# Patient Record
Sex: Female | Born: 1995 | Race: Black or African American | Hispanic: No | Marital: Married | Smoking: Never smoker
Health system: Southern US, Community
[De-identification: ages and names within clinical notes are randomized; demographics above are authoritative.]

## PROBLEM LIST (undated history)

## (undated) DIAGNOSIS — Z789 Other specified health status: Secondary | ICD-10-CM

## (undated) HISTORY — DX: Other specified health status: Z78.9

## (undated) HISTORY — PX: NO PAST SURGERIES: SHX2092

---

## 2017-11-10 NOTE — L&D Delivery Note (Signed)
**Note Colleen-Identified via Obfuscation** Patient: Colleen Sampson MRN: [id number]  GBS status: Positive, IAP given: PCN   Patient is a 22 y.o. now G1P1 s/p NSVD at 4758w5d, who was admitted for IOL for oligohydramnios and NRNST. SROM 8h 6554m prior to delivery with light meconium stained fluid.    Delivery Note At 2:56 AM a viable female was delivered via Vaginal, Spontaneous (Presentation: LOA).  APGAR: 6, 8; weight 7 lb 4.8 oz (3311 g).   Placenta status: spontaneous, intact.  Cord: 3 vessel with the following complications: loose nuchal x1.   Anesthesia:  Epidural  Episiotomy: None Lacerations: 2nd degree Suture Repair: 3.0 monocryl  Est. Blood Loss (mL): 538  Head delivered LOA. Loose nuchal cord x1. Shoulder and body delivered in usual fashion. Cord reduced on mother's abdomen. Infant with spontaneous cry, placed on mother's abdomen, dried and bulb suctioned. Cord clamped x 2 after 30 second delay, and cut by family member. Cord blood drawn. Placenta delivered spontaneously with gentle cord traction. Fundus firm with massage and Pitocin. Perineum inspected and found to have 2nd degree laceration, which was repaired with 3.0 monocryl with good hemostasis achieved.  Mom to postpartum.  Baby to Couplet care / Skin to Skin.  Colleen Sampson 11/02/2018, 4:01 AM

## 2018-01-25 ENCOUNTER — Encounter: Payer: Self-pay | Admitting: Nurse Practitioner

## 2018-01-25 ENCOUNTER — Ambulatory Visit: Payer: Self-pay | Attending: Nurse Practitioner | Admitting: Nurse Practitioner

## 2018-01-25 VITALS — BP 99/65 | HR 78 | Temp 98.6°F | Ht 64.57 in | Wt 147.8 lb

## 2018-01-25 DIAGNOSIS — N979 Female infertility, unspecified: Secondary | ICD-10-CM | POA: Insufficient documentation

## 2018-01-25 DIAGNOSIS — Z3169 Encounter for other general counseling and advice on procreation: Secondary | ICD-10-CM | POA: Insufficient documentation

## 2018-01-25 NOTE — Progress Notes (Signed)
Assessment & Plan:  There are no diagnoses linked to this encounter.  Patient has been counseled on age-appropriate routine health concerns for screening and prevention. These are reviewed and up-to-date. Referrals have been placed accordingly. Immunizations are up-to-date or declined.    Subjective:   Chief Complaint  Patient presents with  . New Patient (Initial Visit)    Patient is here for primary care. Pt's stated she would like to talk to PCP regarding to get pregnant.    HPI Colleen Sampson 22 y.o. female presents to office today to establish care. [place]   Infertility Patient presents for evaluation of infertility. Patient and partner have been attempting conception for 8 months. Marital Status: married for 9 months. Pregnancies with current partner no  Menstrual and Endocrine History LMP: Patient's last menstrual period was 01/22/2018. Menarche:10 Shortest Interval: 23 Longest Interval: 31  days Duration of flow: 5 days Heavy Menses: yes first 3 days Dysmenorrhea: yes  Obstetrical History Never pregnant  Gynecologic History Last PAP: NONE Previous abdominal or pelvic surgery: no  Sexual History Frequency: 3 or 4 times per week(s) Satisfied: yes Dyspareunia: yes  Contraception None  Family History Thyroid Problems: no Heart Condition or High Blood Pressure: mother and father Blood Clot or Stroke: no Diabetes: no Cancer: no Birth Defects/Inherited diseases:no Infectious diseases (mumps, TB, Rubella):unknown Other Medical Problems: no  Habits Cigarettes:    Wife -  no    Husband - no Alcohol:    Wife -  no    Husband - no Marijuana: NO  Review of Systems  Constitutional: Negative for fever, malaise/fatigue and weight loss.  HENT: Negative.  Negative for nosebleeds.   Eyes: Negative.  Negative for blurred vision, double vision and photophobia.  Respiratory: Negative.  Negative for cough and shortness of breath.     Cardiovascular: Negative.  Negative for chest pain, palpitations and leg swelling.  Gastrointestinal: Negative.  Negative for heartburn, nausea and vomiting.  Genitourinary: Negative.   Musculoskeletal: Negative.  Negative for myalgias.  Neurological: Negative.  Negative for dizziness, focal weakness, seizures and headaches.  Psychiatric/Behavioral: Negative.  Negative for suicidal ideas.    History reviewed. No pertinent past medical history.  History reviewed. No pertinent surgical history.  Family History  Problem Relation Age of Onset  . Hypertension Mother   . Hypertension Father     Social History Reviewed with no changes to be made today.   No outpatient medications prior to visit.   No facility-administered medications prior to visit.     Not on File     Objective:    BP 99/65 (BP Location: Right Arm, Patient Position: Sitting, Cuff Size: Normal)   Pulse 78   Temp 98.6 F (37 C) (Oral)   Ht 5' 4.57" (1.64 m)   Wt 147 lb 12.8 oz (67 kg)   LMP 01/22/2018   SpO2 99%   BMI 24.93 kg/m  Wt Readings from Last 3 Encounters:  01/25/18 147 lb 12.8 oz (67 kg)    Physical Exam  Constitutional: She is oriented to person, place, and time. She appears well-developed and well-nourished. She is cooperative.  HENT:  Head: Normocephalic and atraumatic.  Eyes: EOM are normal.  Neck: Normal range of motion.  Cardiovascular: Normal rate, regular rhythm and normal heart sounds. Exam reveals no gallop and no friction rub.  No murmur heard. Pulmonary/Chest: Effort normal and breath sounds normal. No tachypnea. No respiratory distress. She has no decreased breath sounds.  She has no wheezes. She has no rhonchi. She has no rales. She exhibits no tenderness.  Abdominal: Soft. Bowel sounds are normal.  Musculoskeletal: Normal range of motion. She exhibits no edema.  Neurological: She is alert and oriented to person, place, and time. Coordination normal.  Skin: Skin is warm and  dry.  Psychiatric: She has a normal mood and affect. Her behavior is normal. Judgment and thought content normal.  Nursing note and vitals reviewed.     Patient has been counseled extensively about nutrition and exercise as well as the importance of adherence with medications and regular follow-up. The patient was given clear instructions to go to ER or return to medical center if symptoms don't improve, worsen or new problems develop. The patient verbalized understanding.   Follow-up: No Follow-up on file.   Claiborne Rigg, FNP-BC [facility] [place], [place] [phone]   01/25/2018, 2:50 PM

## 2018-01-25 NOTE — Patient Instructions (Addendum)
Infertility  Infertility is when you are unable to get pregnant (conceive) after a year of having sex regularly without using birth control. Infertility can also mean that a woman is not able to carry a pregnancy to full term.  Both women and men can have fertility problems.  What causes infertility?  What Causes Infertility in Women?  There are many possible causes of infertility in women. For some women, the cause of infertility is not known (unexplained infertility). Infertility can also be linked to more than one cause. Infertility problems in women can be caused by problems with the menstrual cycle or reproductive organs, certain medical conditions, and factors related to lifestyle and age.   Problems with your menstrual cycle can interfere with your ovaries producing eggs (ovulation). This can make it difficult to get pregnant. This includes having a menstrual cycle that is very long, very short, or irregular.   Problems with reproductive organs can include:  ? An abnormally narrow cervix or a cervix that does not remain closed during a pregnancy.  ? A blockage in your fallopian tubes.  ? An abnormally shaped uterus.  ? Uterine fibroids. This is a tissue mass (tumor) that can develop on your uterus.   Medical conditions that can affect a woman's fertility include:  ? Polycystic ovarian syndrome (PCOS). This is a hormonal disorder that can cause small cysts to grow on your ovaries. This is the most common cause of infertility in women.  ? Endometriosis. This is a condition in which the tissue that lines your uterus (endometrium) grows outside of its normal location.  ? Primary ovary insufficiency. This is when your ovaries stop producing eggs and hormones before the age of 40.  ? Sexually transmitted diseases, such as chlamydia or gonorrhea. These infections can cause scarring in your fallopian tubes. This makes it difficult for eggs to reach your uterus.  ? Autoimmune disorders. These are disorders in which  your immune system attacks normal, healthy cells.  ? Hormone imbalances.   Other factors include:  ? Age. A woman's fertility declines with age, especially after her mid-30s.  ? Being under- or overweight.  ? Drinking too much alcohol.  ? Using drugs.  ? Exercising excessively.  ? Being exposed to environmental toxins, such as radiation, pesticides, and certain chemicals.    What Causes Infertility in Men?  There are many causes of infertility in men. Infertility can be linked to more than one cause. Infertility problems in men can be caused by problems with sperm or the reproductive organs, certain medical conditions, and factors related to lifestyle and age. Some men have unexplained infertility.   Problems with sperm. Infertility can result if there is a problem producing:  ? Enough sperm (low sperm count).  ? Enough normally-shaped sperm (sperm morphology).  ? Sperm that are able to reach the egg (poor motility).   Infertility can also be caused by:  ? A problem with hormones.  ? Enlarged veins (varicoceles), cysts (spermatoceles), or tumors of the testicles.  ? Sexual dysfunction.  ? Injury to the testicles.  ? A birth defect, such as not having the tubes that carry sperm (vas deferens).   Medical conditions that can affect a man's fertility include:  ? Diabetes.  ? Cancer treatments, such as chemotherapy or radiation.  ? Klinefelter syndrome. This is an inherited genetic disorder.  ? Thyroid problems, such as an under- or overactive thyroid.  ? Cystic fibrosis.  ? Sexually transmitted diseases.   Other factors   include:  ? Age. A man's fertility declines with age.  ? Drinking too much alcohol.  ? Using drugs.  ? Being exposed to environmental toxins, such as pesticides and lead.    What are the symptoms of infertility?  Being unable to get pregnant after one year of having regular sex without using birth control is the only sign of infertility.  How is infertility diagnosed?  In order to be diagnosed with  infertility, both partners will have a physical exam. Both partners will also have an extensive medical and sexual history taken. If there is no obvious reason for infertility, additional tests may be done.  What Tests Will Women Have?  Women may first have tests to check whether they are ovulating each month. The tests may include:   Blood tests to check hormone levels.   An ultrasound of the ovaries. This looks for possible problems on or in the ovaries.   Taking a small sample of the tissue that lines the uterus for examination under a microscope (endometrial biopsy).    Women who are ovulating may have additional tests. These may include:   Hysterosalpingography.  ? This is an X-ray of the fallopian tubes and uterus taken after a specific type of dye is injected.  ? This test can show the shape of the uterus and whether the fallopian tubes are open.   Laparoscopy.  ? In this test, a lighted tube (laparoscope) is used to look for problems in the fallopian tubes and other female organs.   Transvaginal ultrasound.  ? This is an imaging test to check for abnormalities of the uterus and ovaries.  ? A health care provider can use this test to count the number of follicles on the ovaries.   Hysteroscopy.  ? This test involves using a lighted tube to examine the cervix and inside the uterus.  ? It is done to find any abnormalities inside the uterus.    What Tests Will Men Have?  Tests for men's infertility includes:   Semen tests to check sperm count, morphology, and motility.   Blood tests to check for hormone levels.   Taking a small sample of tissue from inside a testicle (biopsy). This is examined under a microscope.   Blood tests to check for genetic abnormalities (genetic testing).    How are women treated for infertility?  Treatment depends on the cause of infertility. Most cases of infertility in women are treated with medicine or surgery.   Women may take medicine to:  ? Correct ovulation  problems.  ? Treat other health conditions, such as PCOS.   Surgery may be done to:  ? Repair damage to the ovaries, fallopian tubes, cervix, or uterus.  ? Remove growths from the uterus.  ? Remove scar tissue from the uterus, pelvis, or other female organs.    How are men treated for infertility?  Treatment depends on the cause of infertility. Most cases of infertility in men are treated with medicine or surgery.   Men may take medicine to:  ? Correct hormone problems.  ? Treat other health conditions.  ? Treat sexual dysfunction.   Surgery may be done to:  ? Remove blockages in the reproductive tract.  ? Correct other structural problems of the reproductive tract.    What is assisted reproductive technology?  Assisted reproductive technology (ART) refers to all treatments and procedures that combine eggs and sperm outside the body to try to help a couple conceive. ART is often   combined with fertility drugs to stimulate ovulation. Sometimes ART is done using eggs retrieved from another woman's body (donor eggs) or from previously frozen fertilized eggs (embryos).  There are different types of ART. These include:   Intrauterine insemination (IUI).  ? In this procedure, sperm is placed directly into a woman's uterus with a long, thin tube.  ? This may be most effective for infertility caused by sperm problems, including low sperm count and low motility.  ? Can be used in combination with fertility drugs.   In vitro fertilization (IVF).  ? This is often done when a woman's fallopian tubes are blocked or when a man has low sperm counts.  ? Fertility drugs stimulate the ovaries to produce multiple eggs. Once mature, these eggs are removed from the body and combined with the sperm to be fertilized.  ? These fertilized eggs are then placed in the woman's uterus.    This information is not intended to replace advice given to you by your health care provider. Make sure you discuss any questions you have with your  health care provider.  Document Released: 10/30/2003 Document Revised: 03/28/2016 Document Reviewed: 07/12/2014  [employer] Interactive Patient Education  2018 [employer]

## 2018-02-05 ENCOUNTER — Ambulatory Visit: Payer: Self-pay | Attending: Nurse Practitioner

## 2018-03-08 ENCOUNTER — Other Ambulatory Visit: Payer: Self-pay | Admitting: Family Medicine

## 2018-03-08 MED ORDER — PRENATAL VITAMIN 27-0.8 MG PO TABS: 1.0000 | ORAL_TABLET | Freq: Every day | ORAL | 1 refills | Status: DC

## 2018-03-10 ENCOUNTER — Ambulatory Visit: Payer: Self-pay | Attending: Nurse Practitioner

## 2018-03-23 ENCOUNTER — Ambulatory Visit (INDEPENDENT_AMBULATORY_CARE_PROVIDER_SITE_OTHER): Payer: Self-pay | Admitting: Nurse Practitioner

## 2018-03-23 ENCOUNTER — Encounter (INDEPENDENT_AMBULATORY_CARE_PROVIDER_SITE_OTHER): Payer: Self-pay | Admitting: Nurse Practitioner

## 2018-03-23 ENCOUNTER — Other Ambulatory Visit: Payer: Self-pay

## 2018-03-23 VITALS — BP 111/71 | HR 84 | Temp 98.2°F | Wt 146.0 lb

## 2018-03-23 DIAGNOSIS — Z3490 Encounter for supervision of normal pregnancy, unspecified, unspecified trimester: Secondary | ICD-10-CM

## 2018-03-23 DIAGNOSIS — N912 Amenorrhea, unspecified: Secondary | ICD-10-CM

## 2018-03-23 NOTE — Patient Instructions (Signed)
Preparing for Pregnancy If you are considering becoming pregnant, make an appointment to see your regular health care provider to learn how to prepare for a safe and healthy pregnancy (preconception care). During a preconception care visit, your health care provider will:  Do a complete physical exam, including a Pap test.  Take a complete medical history.  Give you information, answer your questions, and help you resolve problems.  Preconception checklist Medical history  Tell your health care provider about any current or past medical conditions. Your pregnancy or your ability to become pregnant may be affected by chronic conditions, such as diabetes, chronic hypertension, and thyroid problems.  Include your family's medical history as well as your partner's medical history.  Tell your health care provider about any history of STIs (sexually transmitted infections).These can affect your pregnancy. In some cases, they can be passed to your baby. Discuss any concerns that you have about STIs.  If indicated, discuss the benefits of genetic testing. This testing will show whether there are any genetic conditions that may be passed from you or your partner to your baby.  Tell your health care provider about: ? Any problems you have had with conception or pregnancy. ? Any medicines you take. These include vitamins, herbal supplements, and over-the-counter medicines. ? Your history of immunizations. Discuss any vaccinations that you may need.  Diet  Ask your health care provider what to include in a healthy diet that has a balance of nutrients. This is especially important when you are pregnant or preparing to become pregnant.  Ask your health care provider to help you reach a healthy weight before pregnancy. ? If you are overweight, you may be at higher risk for certain complications, such as high blood pressure, diabetes, and preterm birth. ? If you are underweight, you are more likely  to have a baby who has a low birth weight.  Lifestyle, work, and home  Let your health care provider know: ? About any lifestyle habits that you have, such as alcohol use, drug use, or smoking. ? About recreational activities that may put you at risk during pregnancy, such as downhill skiing and certain exercise programs. ? Tell your health care provider about any international travel, especially any travel to places with an active Zika virus outbreak. ? About harmful substances that you may be exposed to at work or at home. These include chemicals, pesticides, radiation, or even litter boxes. ? If you do not feel safe at home.  Mental health  Tell your health care provider about: ? Any history of mental health conditions, including feelings of depression, sadness, or anxiety. ? Any medicines that you take for a mental health condition. These include herbs and supplements.  Home instructions to prepare for pregnancy Lifestyle  Eat a balanced diet. This includes fresh fruits and vegetables, whole grains, lean meats, low-fat dairy products, healthy fats, and foods that are high in fiber. Ask to meet with a nutritionist or registered dietitian for assistance with meal planning and goals.  Get regular exercise. Try to be active for at least 30 minutes a day on most days of the week. Ask your health care provider which activities are safe during pregnancy.  Do not use any products that contain nicotine or tobacco, such as cigarettes and e-cigarettes. If you need help quitting, ask your health care provider.  Do not drink alcohol.  Do not take illegal drugs.  Maintain a healthy weight. Ask your health care provider what weight range is   right for you.  General instructions  Keep an accurate record of your menstrual periods. This makes it easier for your health care provider to determine your baby's due date.  Begin taking prenatal vitamins and folic acid supplements daily as directed by  your health care provider.  Manage any chronic conditions, such as high blood pressure and diabetes, as told by your health care provider. This is important.  How do I know that I am pregnant? You may be pregnant if you have been sexually active and you miss your period. Symptoms of early pregnancy include:  Mild cramping.  Very light vaginal bleeding (spotting).  Feeling unusually tired.  Nausea and vomiting (morning sickness).  If you have any of these symptoms and you suspect that you might be pregnant, you can take a home pregnancy test. These tests check for a hormone in your urine (human chorionic gonadotropin, or hCG). A woman's body begins to make this hormone during early pregnancy. These tests are very accurate. Wait until at least the first day after you miss your period to take one. If the test shows that you are pregnant (you get a positive result), call your health care provider to make an appointment for prenatal care. What should I do if I become pregnant?  Make an appointment with your health care provider as soon as you suspect you are pregnant.  Do not use any products that contain nicotine, such as cigarettes, chewing tobacco, and e-cigarettes. If you need help quitting, ask your health care provider.  Do not drink alcoholic beverages. Alcohol is related to a number of birth defects.  Avoid toxic odors and chemicals.  You may continue to have sexual intercourse if it does not cause pain or other problems, such as vaginal bleeding. This information is not intended to replace advice given to you by your health care provider. Make sure you discuss any questions you have with your health care provider. Document Released: 10/09/2008 Document Revised: 06/24/2016 Document Reviewed: 05/18/2016 Elsevier Interactive Patient Education  2018 [employer].  Pregnancy, The Father's Role A father has an important role during his partner's pregnancy, labor, delivery, and after  the birth of the baby. It is important to help and support your partner through this new period. There are many physical and emotional changes that happen. To be helpful and supportive during this time, you should know and understand what is happening to your partner during pregnancy, labor, delivery, and after the baby is born. What are the stages of pregnancy? Pregnancy usually lasts about 40 weeks. The pregnancy is divided into three trimesters. First Trimester During the first 13 weeks, your partner may:  Feel tired.  Have painful breasts.  Feel nauseous or throw up.  Urinate more often.  Have mood changes.  All of these changes are normal. If they are happening, try to be helpful, supportive, and understanding. This may include helping with household duties and activities and spending more time with each other. Second Trimester During the next 14-28 weeks:  Your partner will likely feel better and more energetic.  This is the best time of the pregnancy to be more active together.  You will be able to see her belly showing the pregnancy.  You may be able to feel the baby kick.  Your partner may have soreness or aching in her back as she gains weight. You can help her by carrying heavy things and by rubbing her back when she is feeling sore.  Third Trimester During  the final 12 weeks, your partner may:  Become more uncomfortable as the baby grows.  Have a hard time doing everyday activities, and her balance may be off.  Have a hard time bending over.  Tire easily.  Have difficulty sleeping.  At this time, the birth of your baby is close. You and your partner may have concerns or questions. This is normal. Talk with each other and with your health care provider. Continue to help your partner with housework, encourage her to rest, and rub her sore back and legs, if this helps her. What can I expect or do during the pregnancy? You can expect to experience some changes.  There are also many things you can do to help prepare you and your partner for your baby. Emotional Changes During your partner's pregnancy, emotional changes for you may include:  Having feelings of happiness, excitement, and pride.  Being concerned about having new responsibilities, such as financial or educational responsibilities.  Feeling overwhelmed or scared.  Being worried that a baby will change your relationship with your partner.  These feelings are normal. Talk about them openly with your partner and your health care provider. Prenatal Care Attend prenatal care visits with your partner. This is a good time for you to get to know your health care provider, follow the pregnancy, and ask questions.  Prenatal visits usually occur one time each month for six months, then every two weeks for two months, and then one time each week during the last month. You may have more prenatal visits if your health care provider believes this is needed.  Your health care provider usually does an ultrasound of the baby at one of the prenatal visits. This may happen more often if your health care provider thinks it is needed.  Sexual Activity Sexual intercourse is safe unless there is a problem with the pregnancy and your health care provider advises you to not have sexual intercourse. Because physical and emotional changes happen in pregnancy, your partner may not want to have sex during certain times. Trying different positions may make sexual intercourse more comfortable. However, always respect your partner's decision if she does not want to have sex. It is important for both of you to discuss your feelings and desires. Talk with your health care provider about any questions that you may have about sexual intercourse during pregnancy. Childbirth Classes Attend childbirth classes with your partner if you are able. Classes prepare you and help you to understand what happens during labor and delivery,  and they help you and your partner to bond. There are even some classes that are only for new fathers. Classes also teach you and your partner:  Various relaxation techniques.  How to work with her labor pains.  How to focus during labor and delivery.  What should I know about labor and delivery? Many fathers want to be present while their partner is going through labor and delivery. You may:  Be asked to time the contractions, massage your partner's back, and breathe with her during the contractions.  Get to see and enjoy the excitement of your baby being born, and you may even be able to cut your baby's umbilical cord. If you feel like you might faint or you are uncomfortable, ask someone to help you.  Need to leave the room if a problem develops during labor or delivery.  A cesarean delivery, or C-section, is a procedure that may be used to deliver the baby. It is done  through an incision in the abdomen and the uterus. A cesarean delivery may be scheduled or it may be an emergency procedure during labor and delivery. Most hospitals allow the father to be in the room for a cesarean delivery unless it is an emergency. Recovery from a cesarean delivery usually requires more help from the father. What happens after delivery? After your baby is born, your partner will go through many changes again. These changes could last a few months or longer. Postpartum Depression Your partner may take awhile to regain her strength. She may also have feelings of sadness (postpartum blues or postpartum depression). If your partner is acting unusually sad or depressed, talk with your health care provider right away. This can be a serious medical condition that requires treatment. Breastfeeding Your partner may decide to breastfeed the baby. This helps with bonding between the mother and the baby, and breast milk is the best nutrition for your baby. You can feel included by burping the baby and bottle-feeding  the baby with breast milk that was collected from the mother. This allows your partner to rest and helps you to bond with your baby. Sexual Activity It may take a few months for your partner's body to heal and be ready for sexual intercourse again. This may take longer after a cesarean delivery. If you have any questions about having sexual intercourse or if it is painful for your partner, talk with your health care provider. It is possible for breastfeeding mothers to become pregnant even if they are not having menstrual periods. Use birth control (contraception) unless you and your partner would like to become pregnant again. What should I remember? Fatherhood and having a baby is an ongoing learning experience. It is common to be anxious, concerned, or afraid that you may not be taking care of your newborn baby properly. It is important to talk with your partner and your health care provider if you are worried or have any questions. This information is not intended to replace advice given to you by your health care provider. Make sure you discuss any questions you have with your health care provider. Document Released: 04/14/2008 Document Revised: 03/31/2016 Document Reviewed: 07/14/2014 Elsevier Interactive Patient Education  2017 [employer].  First Trimester of Pregnancy The first trimester of pregnancy is from week 1 until the end of week 13 (months 1 through 3). During this time, your baby will begin to develop inside you. At 6-8 weeks, the eyes and face are formed, and the heartbeat can be seen on ultrasound. At the end of 12 weeks, all the baby's organs are formed. Prenatal care is all the medical care you receive before the birth of your baby. Make sure you get good prenatal care and follow all of your doctor's instructions. Follow these instructions at home: Medicines  Take over-the-counter and prescription medicines only as told by your doctor. Some medicines are safe and some medicines  are not safe during pregnancy.  Take a prenatal vitamin that contains at least 600 micrograms (mcg) of folic acid.  If you have trouble pooping (constipation), take medicine that will make your stool soft (stool softener) if your doctor approves. Eating and drinking  Eat regular, healthy meals.  Your doctor will tell you the amount of weight gain that is right for you.  Avoid raw meat and uncooked cheese.  If you feel sick to your stomach (nauseous) or throw up (vomit): ? Eat 4 or 5 small meals a day instead of 3  large meals. ? Try eating a few soda crackers. ? Drink liquids between meals instead of during meals.  To prevent constipation: ? Eat foods that are high in fiber, like fresh fruits and vegetables, whole grains, and beans. ? Drink enough fluids to keep your pee (urine) clear or pale yellow. Activity  Exercise only as told by your doctor. Stop exercising if you have cramps or pain in your lower belly (abdomen) or low back.  Do not exercise if it is too hot, too humid, or if you are in a place of great height (high altitude).  Try to avoid standing for long periods of time. Move your legs often if you must stand in one place for a long time.  Avoid heavy lifting.  Wear low-heeled shoes. Sit and stand up straight.  You can have sex unless your doctor tells you not to. Relieving pain and discomfort  Wear a good support bra if your breasts are sore.  Take warm water baths (sitz baths) to soothe pain or discomfort caused by hemorrhoids. Use hemorrhoid cream if your doctor says it is okay.  Rest with your legs raised if you have leg cramps or low back pain.  If you have puffy, bulging veins (varicose veins) in your legs: ? Wear support hose or compression stockings as told by your doctor. ? Raise (elevate) your feet for 15 minutes, 3-4 times a day. ? Limit salt in your food. Prenatal care  Schedule your prenatal visits by the twelfth week of pregnancy.  Write down  your questions. Take them to your prenatal visits.  Keep all your prenatal visits as told by your doctor. This is important. Safety  Wear your seat belt at all times when driving.  Make a list of emergency phone numbers. The list should include numbers for family, friends, the hospital, and police and fire departments. General instructions  Ask your doctor for a referral to a local prenatal class. Begin classes no later than at the start of month 6 of your pregnancy.  Ask for help if you need counseling or if you need help with nutrition. Your doctor can give you advice or tell you where to go for help.  Do not use hot tubs, steam rooms, or saunas.  Do not douche or use tampons or scented sanitary pads.  Do not cross your legs for long periods of time.  Avoid all herbs and alcohol. Avoid drugs that are not approved by your doctor.  Do not use any tobacco products, including cigarettes, chewing tobacco, and electronic cigarettes. If you need help quitting, ask your doctor. You may get counseling or other support to help you quit.  Avoid cat litter boxes and soil used by cats. These carry germs that can cause birth defects in the baby and can cause a loss of your baby (miscarriage) or stillbirth.  Visit your dentist. At home, brush your teeth with a soft toothbrush. Be gentle when you floss. Contact a doctor if:  You are dizzy.  You have mild cramps or pressure in your lower belly.  You have a nagging pain in your belly area.  You continue to feel sick to your stomach, you throw up, or you have watery poop (diarrhea).  You have a bad smelling fluid coming from your vagina.  You have pain when you pee (urinate).  You have increased puffiness (swelling) in your face, hands, legs, or ankles. Get help right away if:  You have a fever.  You are leaking  fluid from your vagina.  You have spotting or bleeding from your vagina.  You have very bad belly cramping or pain.  You  gain or lose weight rapidly.  You throw up blood. It may look like coffee grounds.  You are around people who have [place] measles, fifth disease, or chickenpox.  You have a very bad headache.  You have shortness of breath.  You have any kind of trauma, such as from a fall or a car accident. Summary  The first trimester of pregnancy is from week 1 until the end of week 13 (months 1 through 3).  To take care of yourself and your unborn baby, you will need to eat healthy meals, take medicines only if your doctor tells you to do so, and do activities that are safe for you and your baby.  Keep all follow-up visits as told by your doctor. This is important as your doctor will have to ensure that your baby is healthy and growing well. This information is not intended to replace advice given to you by your health care provider. Make sure you discuss any questions you have with your health care provider. Document Released: 04/14/2008 Document Revised: 11/04/2016 Document Reviewed: 11/04/2016 Elsevier Interactive Patient Education  2017 [employer].

## 2018-03-23 NOTE — Progress Notes (Signed)
Assessment & Plan:  Colleen Sampson was seen today for amenorrhea.  Diagnoses and all orders for this visit:  Amenorrhea -     hCG, serum, qualitative -     Ambulatory referral to Obstetrics / Gynecology  Pregnancy, unspecified gestational age -     hCG, serum, qualitative -     Ambulatory referral to Obstetrics / Gynecology    Patient has been counseled on age-appropriate routine health concerns for screening and prevention. These are reviewed and up-to-date. Referrals have been placed accordingly. Immunizations are up-to-date or declined.    Subjective:   Chief Complaint  Patient presents with  . Amenorrhea   HPI Colleen Sampson 22 y.o. female presents to office today to confirm a pregnancy. Last menstrual cycle March 15th 2019. Symptoms include: excessive sleepiness; fatigue, nausea and vomiting. Taking prenatal vitamins at night. She denies any abdominal pain or spotting.   Review of Systems  Constitutional: Positive for malaise/fatigue. Negative for fever and weight loss.  HENT: Negative.  Negative for nosebleeds.   Eyes: Negative.  Negative for blurred vision, double vision and photophobia.  Respiratory: Negative.  Negative for cough and shortness of breath.   Cardiovascular: Negative.  Negative for chest pain, palpitations and leg swelling.  Gastrointestinal: Positive for nausea and vomiting. Negative for abdominal pain and heartburn.  Genitourinary:       SEE HPI  Musculoskeletal: Negative.  Negative for myalgias.  Neurological: Negative.  Negative for dizziness, focal weakness, seizures and headaches.  Psychiatric/Behavioral: Negative.  Negative for suicidal ideas.    History reviewed. No pertinent past medical history.  History reviewed. No pertinent surgical history.  Family History  Problem Relation Age of Onset  . Hypertension Mother   . Hypertension Father     Social History Reviewed with no changes to be made today.   Outpatient Medications Prior to Visit   Medication Sig Dispense Refill  . Prenatal Vit-Fe Fumarate-FA (PRENATAL VITAMIN) 27-0.8 MG TABS Take 1 tablet by mouth daily. 30 tablet 1   No facility-administered medications prior to visit.     Not on File     Objective:    BP 111/71 (BP Location: Right Arm, Patient Position: Sitting, Cuff Size: Normal)   Pulse 84   Temp 98.2 F (36.8 C) (Oral)   Wt 146 lb (66.2 kg)   LMP 01/22/2018   SpO2 99%   BMI 24.62 kg/m  Wt Readings from Last 3 Encounters:  03/23/18 146 lb (66.2 kg)  01/25/18 147 lb 12.8 oz (67 kg)    Physical Exam  Constitutional: She is oriented to person, place, and time. She appears well-developed and well-nourished. She is cooperative.  HENT:  Head: Normocephalic and atraumatic.  Eyes: EOM are normal.  Neck: Normal range of motion.  Cardiovascular: Normal rate, regular rhythm and normal heart sounds. Exam reveals no gallop and no friction rub.  No murmur heard. Pulmonary/Chest: Effort normal and breath sounds normal. No tachypnea. No respiratory distress. She has no decreased breath sounds. She has no wheezes. She has no rhonchi. She has no rales. She exhibits no tenderness.  Abdominal: Soft. Bowel sounds are normal.  Musculoskeletal: Normal range of motion. She exhibits no edema.  Neurological: She is alert and oriented to person, place, and time. Coordination normal.  Skin: Skin is warm and dry.  Psychiatric: She has a normal mood and affect. Her behavior is normal. Judgment and thought content normal.  Nursing note and vitals reviewed.        Patient has been  counseled extensively about nutrition and exercise as well as the importance of adherence with medications and regular follow-up. The patient was given clear instructions to go to ER or return to medical center if symptoms don't improve, worsen or new problems develop. The patient verbalized understanding.   Follow-up: Return if symptoms worsen or fail to improve.   Claiborne Rigg,  FNP-BC [facility] [place], [place] [phone]   03/23/2018, 4:52 PM

## 2018-03-24 LAB — HCG, SERUM, QUALITATIVE: HCG, BETA SUBUNIT, QUAL, SERUM: POSITIVE m[IU]/mL — AB (ref <6)

## 2018-03-26 ENCOUNTER — Telehealth (INDEPENDENT_AMBULATORY_CARE_PROVIDER_SITE_OTHER): Payer: Self-pay

## 2018-03-26 NOTE — Telephone Encounter (Signed)
-----   Message from Claiborne Rigg, NP sent at 03/25/2018 11:57 PM EDT ----- HCG is positive however unfortunately it does not show the gestational age. This will be determined by gynecology

## 2018-03-26 NOTE — Telephone Encounter (Signed)
Patients husband aware that HCG is positive but does not indicate gestational age. Gestational age will be dtermined by GYN. Maryjean Morn, CMA

## 2018-04-22 ENCOUNTER — Telehealth: Payer: Self-pay | Admitting: General Practice

## 2018-04-22 NOTE — Telephone Encounter (Signed)
[facility].  Patient verbalized understanding.

## 2018-04-23 ENCOUNTER — Encounter: Payer: Self-pay | Admitting: Family

## 2018-05-12 ENCOUNTER — Other Ambulatory Visit (HOSPITAL_COMMUNITY)
Admission: RE | Admit: 2018-05-12 | Discharge: 2018-05-12 | Disposition: A | Discharge status: Home / Self Care | Payer: Medicaid Other | Source: Ambulatory Visit | Attending: Advanced Practice Midwife | Admitting: Advanced Practice Midwife

## 2018-05-12 ENCOUNTER — Encounter: Payer: Self-pay | Admitting: Advanced Practice Midwife

## 2018-05-12 ENCOUNTER — Ambulatory Visit (INDEPENDENT_AMBULATORY_CARE_PROVIDER_SITE_OTHER): Payer: Medicaid Other | Admitting: Advanced Practice Midwife

## 2018-05-12 VITALS — BP 112/64 | HR 92 | Wt 147.7 lb

## 2018-05-12 DIAGNOSIS — Z3A00 Weeks of gestation of pregnancy not specified: Secondary | ICD-10-CM | POA: Diagnosis not present

## 2018-05-12 DIAGNOSIS — Z3480 Encounter for supervision of other normal pregnancy, unspecified trimester: Secondary | ICD-10-CM | POA: Diagnosis present

## 2018-05-12 DIAGNOSIS — B373 Candidiasis of vulva and vagina: Secondary | ICD-10-CM

## 2018-05-12 DIAGNOSIS — B3731 Acute candidiasis of vulva and vagina: Secondary | ICD-10-CM

## 2018-05-12 DIAGNOSIS — Z3402 Encounter for supervision of normal first pregnancy, second trimester: Secondary | ICD-10-CM

## 2018-05-12 MED ORDER — TERCONAZOLE 0.4 % VA CREA: 1.0000 | TOPICAL_CREAM | Freq: Every day | VAGINAL | 0 refills | Status: DC

## 2018-05-12 NOTE — Progress Notes (Signed)
   PRENATAL VISIT NOTE  Subjective:  Colleen Sampson is a 22 y.o. G1P0000 at 433w6d being seen today for ongoing prenatal care.  She is currently monitored for the following issues for this low-risk pregnancy and has Infertility counseling and Supervision of other normal pregnancy, antepartum on their problem list.  Patient reports vaginal itching.  Contractions: Not present. Vag. Bleeding: None.  Movement: Absent. Denies leaking of fluid.   The following portions of the patient's history were reviewed and updated as appropriate: allergies, current medications, past family history, past medical history, past social history, past surgical history and problem list. Problem list updated.  Objective:   Vitals:   05/12/18 1404  BP: 112/64  Pulse: 92  Weight: 147 lb 11.2 oz (67 kg)    Fetal Status: Fetal Heart Rate (bpm): 157   Movement: Absent     VS reviewed, nursing note reviewed,  Constitutional: well developed, well nourished, no distress HEENT: normocephalic CV: normal rate Pulm/chest wall: normal effort Breast Exam:Deferred Abdomen: soft Neuro: alert and oriented x 3 Skin: warm, dry Psych: affect normal Pelvic exam: Cervix pink, visually closed, without lesion, large amount thick white discharge, vaginal walls and external genitalia normal Bimanual exam: Cervix 0/long/high, firm, anterior, neg CMT, uterus nontender, ~ 15 week size, adnexa without tenderness, enlargement, or mass Assessment and Plan:  Pregnancy: G1P0000 at 613w6d  1. Supervision of other normal pregnancy, antepartum --Discussed our practice, with CNMs, MDs, residents, and students.  Pt prefers female providers.  --Anticipatory guidance about next visits/weeks of pregnancy given. --Discussed and offered genetic screening options, including Quad screen/AFP, NIPS testing, and option to decline testing.  Discussed anatomy US as genetic screening but US is less sensitive and may miss Down Syndrome and other birth  defects. Pt selects to have genetic testing.  - Culture, OB Urine - Cystic fibrosis gene test - Hemoglobinopathy Evaluation - Obstetric Panel, Including HIV - SMN1 COPY NUMBER ANALYSIS (SMA Carrier Screen) - US MFM OB COMP + 14 WK; Future - Genetic Screening - Cytology - PAP  2. Vaginal candidiasis --On physical exam and pt reports vaginal itching and discharge - terconazole (TERAZOL 7) 0.4 % vaginal cream; Place 1 applicator vaginally at bedtime.  Dispense: 45 g; Refill: 0  Preterm labor symptoms and general obstetric precautions including but not limited to vaginal bleeding, contractions, leaking of fluid and fetal movement were reviewed in detail with the patient. Please refer to After Visit Summary for other counseling recommendations.  No follow-ups on file.  Future Appointments  Date Time Provider Department Center  06/03/2018 10:15 AM [facility] US 4 [facility]    Sharen CounterLisa Leftwich-Kirby, CNM

## 2018-05-12 NOTE — Patient Instructions (Addendum)
Second Trimester of Pregnancy The second trimester is from week 14 through week 27 (months 4 through 6). The second trimester is often a time when you feel your best. Your body has adjusted to being pregnant, and you begin to feel better physically. Usually, morning sickness has lessened or quit completely, you may have more energy, and you may have an increase in appetite. The second trimester is also a time when the fetus is growing rapidly. At the end of the sixth month, the fetus is about 9 inches long and weighs about 1 pounds. You will likely begin to feel the baby move (quickening) between 16 and 20 weeks of pregnancy. Body changes during your second trimester Your body continues to go through many changes during your second trimester. The changes vary from woman to woman.  Your weight will continue to increase. You will notice your lower abdomen bulging out.  You may begin to get stretch marks on your hips, abdomen, and breasts.  You may develop headaches that can be relieved by medicines. The medicines should be approved by your health care provider.  You may urinate more often because the fetus is pressing on your bladder.  You may develop or continue to have heartburn as a result of your pregnancy.  You may develop constipation because certain hormones are causing the muscles that push waste through your intestines to slow down.  You may develop hemorrhoids or swollen, bulging veins (varicose veins).  You may have back pain. This is caused by: ? Weight gain. ? Pregnancy hormones that are relaxing the joints in your pelvis. ? A shift in weight and the muscles that support your balance.  Your breasts will continue to grow and they will continue to become tender.  Your gums may bleed and may be sensitive to brushing and flossing.  Dark spots or blotches (chloasma, mask of pregnancy) may develop on your face. This will likely fade after the baby is born.  A dark line from your  belly button to the pubic area (linea nigra) may appear. This will likely fade after the baby is born.  You may have changes in your hair. These can include thickening of your hair, rapid growth, and changes in texture. Some women also have hair loss during or after pregnancy, or hair that feels dry or thin. Your hair will most likely return to normal after your baby is born.  What to expect at prenatal visits During a routine prenatal visit:  You will be weighed to make sure you and the fetus are growing normally.  Your blood pressure will be taken.  Your abdomen will be measured to track your baby's growth.  The fetal heartbeat will be listened to.  Any test results from the previous visit will be discussed.  Your health care provider may ask you:  How you are feeling.  If you are feeling the baby move.  If you have had any abnormal symptoms, such as leaking fluid, bleeding, severe headaches, or abdominal cramping.  If you are using any tobacco products, including cigarettes, chewing tobacco, and electronic cigarettes.  If you have any questions.  Other tests that may be performed during your second trimester include:  Blood tests that check for: ? Low iron levels (anemia). ? High blood sugar that affects pregnant women (gestational diabetes) between 24 and 28 weeks. ? Rh antibodies. This is to check for a protein on red blood cells (Rh factor).  Urine tests to check for infections, diabetes, or   protein in the urine.  An ultrasound to confirm the proper growth and development of the baby.  An amniocentesis to check for possible genetic problems.  Fetal screens for spina bifida and Down syndrome.  HIV (human immunodeficiency virus) testing. Routine prenatal testing includes screening for HIV, unless you choose not to have this test.  Follow these instructions at home: Medicines  Follow your health care provider's instructions regarding medicine use. Specific  medicines may be either safe or unsafe to take during pregnancy.  Take a prenatal vitamin that contains at least 600 micrograms (mcg) of folic acid.  If you develop constipation, try taking a stool softener if your health care provider approves. Eating and drinking  Eat a balanced diet that includes fresh fruits and vegetables, whole grains, good sources of protein such as meat, eggs, or tofu, and low-fat dairy. Your health care provider will help you determine the amount of weight gain that is right for you.  Avoid raw meat and uncooked cheese. These carry germs that can cause birth defects in the baby.  If you have low calcium intake from food, talk to your health care provider about whether you should take a daily calcium supplement.  Limit foods that are high in fat and processed sugars, such as fried and sweet foods.  To prevent constipation: ? Drink enough fluid to keep your urine clear or pale yellow. ? Eat foods that are high in fiber, such as fresh fruits and vegetables, whole grains, and beans. Activity  Exercise only as directed by your health care provider. Most women can continue their usual exercise routine during pregnancy. Try to exercise for 30 minutes at least 5 days a week. Stop exercising if you experience uterine contractions.  Avoid heavy lifting, wear low heel shoes, and practice good posture.  A sexual relationship may be continued unless your health care provider directs you otherwise. Relieving pain and discomfort  Wear a good support bra to prevent discomfort from breast tenderness.  Take warm sitz baths to soothe any pain or discomfort caused by hemorrhoids. Use hemorrhoid cream if your health care provider approves.  Rest with your legs elevated if you have leg cramps or low back pain.  If you develop varicose veins, wear support hose. Elevate your feet for 15 minutes, 3-4 times a day. Limit salt in your diet. Prenatal Care  Write down your questions.  Take them to your prenatal visits.  Keep all your prenatal visits as told by your health care provider. This is important. Safety  Wear your seat belt at all times when driving.  Make a list of emergency phone numbers, including numbers for family, friends, the hospital, and police and fire departments. General instructions  Ask your health care provider for a referral to a local prenatal education class. Begin classes no later than the beginning of month 6 of your pregnancy.  Ask for help if you have counseling or nutritional needs during pregnancy. Your health care provider can offer advice or refer you to specialists for help with various needs.  Do not use hot tubs, steam rooms, or saunas.  Do not douche or use tampons or scented sanitary pads.  Do not cross your legs for long periods of time.  Avoid cat litter boxes and soil used by cats. These carry germs that can cause birth defects in the baby and possibly loss of the fetus by miscarriage or stillbirth.  Avoid all smoking, herbs, alcohol, and unprescribed drugs. Chemicals in these products can   affect the formation and growth of the baby.  Do not use any products that contain nicotine or tobacco, such as cigarettes and e-cigarettes. If you need help quitting, ask your health care provider.  Visit your dentist if you have not gone yet during your pregnancy. Use a soft toothbrush to brush your teeth and be gentle when you floss. Contact a health care provider if:  You have dizziness.  You have mild pelvic cramps, pelvic pressure, or nagging pain in the abdominal area.  You have persistent nausea, vomiting, or diarrhea.  You have a bad smelling vaginal discharge.  You have pain when you urinate. Get help right away if:  You have a fever.  You are leaking fluid from your vagina.  You have spotting or bleeding from your vagina.  You have severe abdominal cramping or pain.  You have rapid weight gain or weight  loss.  You have shortness of breath with chest pain.  You notice sudden or extreme swelling of your face, hands, ankles, feet, or legs.  You have not felt your baby move in over an hour.  You have severe headaches that do not go away when you take medicine.  You have vision changes. Summary  The second trimester is from week 14 through week 27 (months 4 through 6). It is also a time when the fetus is growing rapidly.  Your body goes through many changes during pregnancy. The changes vary from woman to woman.  Avoid all smoking, herbs, alcohol, and unprescribed drugs. These chemicals affect the formation and growth your baby.  Do not use any tobacco products, such as cigarettes, chewing tobacco, and e-cigarettes. If you need help quitting, ask your health care provider.  Contact your health care provider if you have any questions. Keep all prenatal visits as told by your health care provider. This is important. This information is not intended to replace advice given to you by your health care provider. Make sure you discuss any questions you have with your health care provider. Document Released: 10/21/2001 Document Revised: 12/02/2016 Document Reviewed: 12/02/2016 Elsevier Interactive Patient Education  2018 [employer].   Vaginal Yeast infection, Adult Vaginal yeast infection is a condition that causes soreness, swelling, and redness (inflammation) of the vagina. It also causes vaginal discharge. This is a common condition. Some women get this infection frequently. What are the causes? This condition is caused by a change in the normal balance of the yeast (candida) and bacteria that live in the vagina. This change causes an overgrowth of yeast, which causes the inflammation. What increases the risk? This condition is more likely to develop in:  Women who take antibiotic medicines.  Women who have diabetes.  Women who take birth control pills.  Women who are  pregnant.  Women who douche often.  Women who have a weak defense (immune) system.  Women who have been taking steroid medicines for a long time.  Women who frequently wear tight clothing.  What are the signs or symptoms? Symptoms of this condition include:  White, thick vaginal discharge.  Swelling, itching, redness, and irritation of the vagina. The lips of the vagina (vulva) may be affected as well.  Pain or a burning feeling while urinating.  Pain during sex.  How is this diagnosed? This condition is diagnosed with a medical history and physical exam. This will include a pelvic exam. Your health care provider will examine a sample of your vaginal discharge under a microscope. Your health care provider may  send this sample for testing to confirm the diagnosis. How is this treated? This condition is treated with medicine. Medicines may be over-the-counter or prescription. You may be told to use one or more of the following:  Medicine that is taken orally.  Medicine that is applied as a cream.  Medicine that is inserted directly into the vagina (suppository).  Follow these instructions at home:  Take or apply over-the-counter and prescription medicines only as told by your health care provider.  Do not have sex until your health care provider has approved. Tell your sex partner that you have a yeast infection. That person should go to his or her health care provider if he or she develops symptoms.  Do not wear tight clothes, such as pantyhose or tight pants.  Avoid using tampons until your health care provider approves.  Eat more yogurt. This may help to keep your yeast infection from returning.  Try taking a sitz bath to help with discomfort. This is a warm water bath that is taken while you are sitting down. The water should only come up to your hips and should cover your buttocks. Do this 3-4 times per day or as told by your health care provider.  Do not  douche.  Wear breathable, cotton underwear.  If you have diabetes, keep your blood sugar levels under control. Contact a health care provider if:  You have a fever.  Your symptoms go away and then return.  Your symptoms do not get better with treatment.  Your symptoms get worse.  You have new symptoms.  You develop blisters in or around your vagina.  You have blood coming from your vagina and it is not your menstrual period.  You develop pain in your abdomen. This information is not intended to replace advice given to you by your health care provider. Make sure you discuss any questions you have with your health care provider. Document Released: 08/06/2005 Document Revised: 04/09/2016 Document Reviewed: 04/30/2015 Elsevier Interactive Patient Education  2018 [employer]

## 2018-05-14 LAB — CYTOLOGY - PAP
Adequacy: ABSENT
Chlamydia: NEGATIVE
DIAGNOSIS: NEGATIVE
Neisseria Gonorrhea: NEGATIVE

## 2018-05-16 LAB — URINE CULTURE, OB REFLEX

## 2018-05-16 LAB — CULTURE, OB URINE

## 2018-05-20 LAB — SMN1 COPY NUMBER ANALYSIS (SMA CARRIER SCREENING)

## 2018-05-20 LAB — OBSTETRIC PANEL, INCLUDING HIV
ANTIBODY SCREEN: NEGATIVE
BASOS ABS: 0 10*3/uL (ref 0.0–0.2)
BASOS: 0 %
EOS (ABSOLUTE): 0.2 10*3/uL (ref 0.0–0.4)
Eos: 2 %
HIV SCREEN 4TH GENERATION: NONREACTIVE
Hematocrit: 33.8 % — ABNORMAL LOW (ref 34.0–46.6)
Hemoglobin: 11.6 g/dL (ref 11.1–15.9)
Hepatitis B Surface Ag: NEGATIVE
Immature Grans (Abs): 0 10*3/uL (ref 0.0–0.1)
Immature Granulocytes: 0 %
LYMPHS ABS: 1.5 10*3/uL (ref 0.7–3.1)
LYMPHS: 24 %
MCH: 26.9 pg (ref 26.6–33.0)
MCHC: 34.3 g/dL (ref 31.5–35.7)
MCV: 78 fL — ABNORMAL LOW (ref 79–97)
MONOCYTES: 9 %
Monocytes Absolute: 0.6 10*3/uL (ref 0.1–0.9)
NEUTROS ABS: 4.2 10*3/uL (ref 1.4–7.0)
NEUTROS PCT: 65 %
PLATELETS: 289 10*3/uL (ref 150–450)
RBC: 4.32 x10E6/uL (ref 3.77–5.28)
RDW: 15.4 % (ref 12.3–15.4)
RH TYPE: POSITIVE
RPR Ser Ql: NONREACTIVE
Rubella Antibodies, IGG: 19.5 index (ref >0.99)
WBC: 6.5 10*3/uL (ref 3.4–10.8)

## 2018-05-20 LAB — HEMOGLOBINOPATHY EVALUATION
FERRITIN: 12 ng/mL — AB (ref 15–150)
HGB A2 QUANT: 2.4 % (ref 1.8–3.2)
HGB C: 0 %
HGB S: 0 %
HGB VARIANT: 0 %
Hgb A: 97.6 % (ref 96.4–98.8)
Hgb F Quant: 0 % (ref 0.0–2.0)
Hgb Solubility: NEGATIVE

## 2018-05-20 LAB — CYSTIC FIBROSIS GENE TEST

## 2018-05-26 ENCOUNTER — Encounter: Payer: Self-pay | Admitting: *Deleted

## 2018-05-27 ENCOUNTER — Encounter (HOSPITAL_COMMUNITY): Payer: Self-pay

## 2018-05-28 ENCOUNTER — Encounter: Payer: Self-pay | Admitting: *Deleted

## 2018-06-03 ENCOUNTER — Ambulatory Visit (HOSPITAL_COMMUNITY)
Admission: RE | Admit: 2018-06-03 | Discharge: 2018-06-03 | Disposition: A | Discharge status: Home / Self Care | Payer: Medicaid Other | Source: Ambulatory Visit | Attending: Advanced Practice Midwife | Admitting: Advanced Practice Midwife

## 2018-06-03 ENCOUNTER — Other Ambulatory Visit (HOSPITAL_COMMUNITY): Payer: Self-pay | Admitting: *Deleted

## 2018-06-03 DIAGNOSIS — Z3480 Encounter for supervision of other normal pregnancy, unspecified trimester: Secondary | ICD-10-CM | POA: Diagnosis present

## 2018-06-03 DIAGNOSIS — Z363 Encounter for antenatal screening for malformations: Secondary | ICD-10-CM | POA: Diagnosis not present

## 2018-06-03 DIAGNOSIS — Z3482 Encounter for supervision of other normal pregnancy, second trimester: Secondary | ICD-10-CM | POA: Diagnosis not present

## 2018-06-03 DIAGNOSIS — Z362 Encounter for other antenatal screening follow-up: Secondary | ICD-10-CM

## 2018-06-03 DIAGNOSIS — Z3A19 19 weeks gestation of pregnancy: Secondary | ICD-10-CM | POA: Insufficient documentation

## 2018-06-08 ENCOUNTER — Encounter: Payer: Medicaid Other | Admitting: Medical

## 2018-06-08 ENCOUNTER — Encounter: Payer: Medicaid Other | Admitting: Student

## 2018-06-09 ENCOUNTER — Ambulatory Visit (INDEPENDENT_AMBULATORY_CARE_PROVIDER_SITE_OTHER): Payer: Medicaid Other | Admitting: Student

## 2018-06-09 VITALS — BP 120/61 | HR 89 | Wt 154.5 lb

## 2018-06-09 DIAGNOSIS — Z789 Other specified health status: Secondary | ICD-10-CM

## 2018-06-09 DIAGNOSIS — Z758 Other problems related to medical facilities and other health care: Secondary | ICD-10-CM | POA: Insufficient documentation

## 2018-06-09 DIAGNOSIS — Z3480 Encounter for supervision of other normal pregnancy, unspecified trimester: Secondary | ICD-10-CM

## 2018-06-09 NOTE — Patient Instructions (Signed)
Second Trimester of Pregnancy The second trimester is from week 13 through week 28, month 4 through 6. This is often the time in pregnancy that you feel your best. Often times, morning sickness has lessened or quit. You may have more energy, and you may get hungry more often. Your unborn baby (fetus) is growing rapidly. At the end of the sixth month, he or she is about 9 inches long and weighs about 1 pounds. You will likely feel the baby move (quickening) between 18 and 20 weeks of pregnancy.  Research childbirth classes and hospital preregistration at [email or web address]  Follow these instructions at home:  Avoid all smoking, herbs, and alcohol. Avoid drugs not approved by your doctor.  Do not use any tobacco products, including cigarettes, chewing tobacco, and electronic cigarettes. If you need help quitting, ask your doctor. You may get counseling or other support to help you quit.  Only take medicine as told by your doctor. Some medicines are safe and some are not during pregnancy.  Exercise only as told by your doctor. Stop exercising if you start having cramps.  Eat regular, healthy meals.  Wear a good support bra if your breasts are tender.  Do not use hot tubs, steam rooms, or saunas.  Wear your seat belt when driving.  Avoid raw meat, uncooked cheese, and liter boxes and soil used by cats.  Take your prenatal vitamins.  Take 1500-2000 milligrams of calcium daily starting at the 20th week of pregnancy until you deliver your baby.  Try taking medicine that helps you poop (stool softener) as needed, and if your doctor approves. Eat more fiber by eating fresh fruit, vegetables, and whole grains. Drink enough fluids to keep your pee (urine) clear or pale yellow.  Take warm water baths (sitz baths) to soothe pain or discomfort caused by hemorrhoids. Use hemorrhoid cream if your doctor approves.  If you have puffy, bulging veins (varicose veins), wear support hose. Raise  (elevate) your feet for 15 minutes, 3-4 times a day. Limit salt in your diet.  Avoid heavy lifting, wear low heals, and sit up straight.  Rest with your legs raised if you have leg cramps or low back pain.  Visit your dentist if you have not gone during your pregnancy. Use a soft toothbrush to brush your teeth. Be gentle when you floss.  You can have sex (intercourse) unless your doctor tells you not to.  Go to your doctor visits.  Get help if:  You feel dizzy.  You have mild cramps or pressure in your lower belly (abdomen).  You have a nagging pain in your belly area.  You continue to feel sick to your stomach (nauseous), throw up (vomit), or have watery poop (diarrhea).  You have bad smelling fluid coming from your vagina.  You have pain with peeing (urination). Get help right away if:  You have a fever.  You are leaking fluid from your vagina.  You have spotting or bleeding from your vagina.  You have severe belly cramping or pain.  You lose or gain weight rapidly.  You have trouble catching your breath and have chest pain.  You notice sudden or extreme puffiness (swelling) of your face, hands, ankles, feet, or legs.  You have not felt the baby move in over an hour.  You have severe headaches that do not go away with medicine.  You have vision changes. This information is not intended to replace advice given to you by your health care provider. Make   sure you discuss any questions you have with your health care provider. Document Released: 01/21/2010 Document Revised: 04/03/2016 Document Reviewed: 12/28/2012 Elsevier Interactive Patient Education  2017 Elsevier Inc.    

## 2018-06-09 NOTE — Progress Notes (Signed)
   PRENATAL VISIT NOTE  Subjective:  Colleen Sampson is a 22 y.o. G1P0000 at 345w6d being seen today for ongoing prenatal care.  She is currently monitored for the following issues for this low-risk pregnancy and has Infertility counseling; Supervision of other normal pregnancy, antepartum; and Language barrier on their problem list.  Patient reports no complaints.  Contractions: Not present. Vag. Bleeding: None.  Movement: Absent. Denies leaking of fluid.   The following portions of the patient's history were reviewed and updated as appropriate: allergies, current medications, past family history, past medical history, past social history, past surgical history and problem list. Problem list updated.  Objective:   Vitals:   06/09/18 1026  BP: 120/61  Pulse: 89  Weight: 154 lb 8 oz (70.1 kg)    Fetal Status: Fetal Heart Rate (bpm): 148   Movement: Absent    Fundal height 1 FB below umbilicus  General:  Alert, oriented and cooperative. Patient is in no acute distress.  Skin: Skin is warm and dry. No rash noted.   Cardiovascular: Normal heart rate noted  Respiratory: Normal respiratory effort, no problems with respiration noted  Abdomen: Soft, gravid, appropriate for gestational age.  Pain/Pressure: Present     Pelvic: Cervical exam deferred        Extremities: Normal range of motion.  Edema: None  Mental Status: Normal mood and affect. Normal behavior. Normal judgment and thought content.   Assessment and Plan:  Pregnancy: G1P0000 at 655w6d  1. Supervision of other normal pregnancy, antepartum -Doing well -reviewed previous labs with patient -reviewed anatomy ultrasound with patient & need for f/u to complete anatomy which has already been scheduled - AFP, Serum, Open Spina Bifida  2. Language barrier -Arabic interpreter at bedside  Preterm labor symptoms and general obstetric precautions including but not limited to vaginal bleeding, contractions, leaking of fluid and fetal  movement were reviewed in detail with the patient. Please refer to After Visit Summary for other counseling recommendations.  Return in about 1 month (around 07/07/2018) for Routine OB.  Future Appointments  Date Time Provider Department Center  07/05/2018  1:00 PM [facility] US 3 [facility]  07/08/2018  3:15 PM Judeth HornLawrence, Jakaria Lavergne, NP [facility]    Judeth HornErin Marte Celani, NP

## 2018-06-11 ENCOUNTER — Other Ambulatory Visit: Payer: Self-pay | Admitting: Obstetrics and Gynecology

## 2018-06-11 DIAGNOSIS — Z3480 Encounter for supervision of other normal pregnancy, unspecified trimester: Secondary | ICD-10-CM

## 2018-06-11 LAB — AFP, SERUM, OPEN SPINA BIFIDA
AFP MoM: 0.52
AFP Value: 30.4 ng/mL
Gest. Age on Collection Date: 19.9 weeks
Maternal Age At EDD: 24.9 yr
OSBR RISK 1 IN: 10000
Test Results:: NEGATIVE
Weight: 154 [lb_av]

## 2018-07-01 ENCOUNTER — Ambulatory Visit (HOSPITAL_COMMUNITY): Payer: Medicaid Other

## 2018-07-05 ENCOUNTER — Ambulatory Visit (HOSPITAL_COMMUNITY)
Admission: RE | Admit: 2018-07-05 | Discharge: 2018-07-05 | Disposition: A | Discharge status: Home / Self Care | Payer: Medicaid Other | Source: Ambulatory Visit | Attending: Advanced Practice Midwife | Admitting: Advanced Practice Midwife

## 2018-07-05 DIAGNOSIS — Z3A23 23 weeks gestation of pregnancy: Secondary | ICD-10-CM | POA: Insufficient documentation

## 2018-07-05 DIAGNOSIS — Z362 Encounter for other antenatal screening follow-up: Secondary | ICD-10-CM

## 2018-07-08 ENCOUNTER — Ambulatory Visit (INDEPENDENT_AMBULATORY_CARE_PROVIDER_SITE_OTHER): Payer: Medicaid Other | Admitting: Student

## 2018-07-08 VITALS — BP 111/67 | HR 91 | Wt 161.0 lb

## 2018-07-08 DIAGNOSIS — Z3480 Encounter for supervision of other normal pregnancy, unspecified trimester: Secondary | ICD-10-CM

## 2018-07-08 DIAGNOSIS — Z3482 Encounter for supervision of other normal pregnancy, second trimester: Secondary | ICD-10-CM

## 2018-07-08 DIAGNOSIS — Z789 Other specified health status: Secondary | ICD-10-CM

## 2018-07-08 NOTE — Progress Notes (Signed)
   PRENATAL VISIT NOTE  Subjective:  Colleen Sampson is a [age] y.o. G1P0000 at 6311w0d being seen today for ongoing prenatal care.  She is currently monitored for the following issues for this low-risk pregnancy and has Infertility counseling; Supervision of other normal pregnancy, antepartum; and Language barrier on their problem list.  Patient reports no complaints.  Contractions: Not present. Vag. Bleeding: None.  Movement: Present. Denies leaking of fluid.   The following portions of the patient's history were reviewed and updated as appropriate: allergies, current medications, past family history, past medical history, past social history, past surgical history and problem list. Problem list updated.  Objective:   Vitals:   07/08/18 1555  BP: 111/67  Pulse: 91  Weight: 161 lb (73 kg)    Fetal Status: Fetal Heart Rate (bpm): 154 Fundal Height: 24 cm Movement: Present     General:  Alert, oriented and cooperative. Patient is in no acute distress.  Skin: Skin is warm and dry. No rash noted.   Cardiovascular: Normal heart rate noted  Respiratory: Normal respiratory effort, no problems with respiration noted  Abdomen: Soft, gravid, appropriate for gestational age.  Pain/Pressure: Absent     Pelvic: Cervical exam deferred        Extremities: Normal range of motion.  Edema: None  Mental Status: Normal mood and affect. Normal behavior. Normal judgment and thought content.   Assessment and Plan:  Pregnancy: G1P0000 at 4511w0d  1. Supervision of other normal pregnancy, antepartum -doing well -discussed plan for diabetes screening next visit  2. Language barrier -Arabic interpreter at bedside  Preterm labor symptoms and general obstetric precautions including but not limited to vaginal bleeding, contractions, leaking of fluid and fetal movement were reviewed in detail with the patient. Please refer to After Visit Summary for other counseling recommendations.  Return in about 4 weeks  (around 08/05/2018) for Routine OB & fasting 28 wk labs.  No future appointments.  Judeth HornErin Iman Reinertsen, NP

## 2018-07-08 NOTE — Patient Instructions (Signed)
Second Trimester of Pregnancy The second trimester is from week 13 through week 28, month 4 through 6. This is often the time in pregnancy that you feel your best. Often times, morning sickness has lessened or quit. You may have more energy, and you may get hungry more often. Your unborn baby (fetus) is growing rapidly. At the end of the sixth month, he or she is about 9 inches long and weighs about 1 pounds. You will likely feel the baby move (quickening) between 18 and 20 weeks of pregnancy.  Research childbirth classes and hospital preregistration at [email or web address]  Follow these instructions at home:  Avoid all smoking, herbs, and alcohol. Avoid drugs not approved by your doctor.  Do not use any tobacco products, including cigarettes, chewing tobacco, and electronic cigarettes. If you need help quitting, ask your doctor. You may get counseling or other support to help you quit.  Only take medicine as told by your doctor. Some medicines are safe and some are not during pregnancy.  Exercise only as told by your doctor. Stop exercising if you start having cramps.  Eat regular, healthy meals.  Wear a good support bra if your breasts are tender.  Do not use hot tubs, steam rooms, or saunas.  Wear your seat belt when driving.  Avoid raw meat, uncooked cheese, and liter boxes and soil used by cats.  Take your prenatal vitamins.  Take 1500-2000 milligrams of calcium daily starting at the 20th week of pregnancy until you deliver your baby.  Try taking medicine that helps you poop (stool softener) as needed, and if your doctor approves. Eat more fiber by eating fresh fruit, vegetables, and whole grains. Drink enough fluids to keep your pee (urine) clear or pale yellow.  Take warm water baths (sitz baths) to soothe pain or discomfort caused by hemorrhoids. Use hemorrhoid cream if your doctor approves.  If you have puffy, bulging veins (varicose veins), wear support hose. Raise  (elevate) your feet for 15 minutes, 3-4 times a day. Limit salt in your diet.  Avoid heavy lifting, wear low heals, and sit up straight.  Rest with your legs raised if you have leg cramps or low back pain.  Visit your dentist if you have not gone during your pregnancy. Use a soft toothbrush to brush your teeth. Be gentle when you floss.  You can have sex (intercourse) unless your doctor tells you not to.  Go to your doctor visits.  Get help if:  You feel dizzy.  You have mild cramps or pressure in your lower belly (abdomen).  You have a nagging pain in your belly area.  You continue to feel sick to your stomach (nauseous), throw up (vomit), or have watery poop (diarrhea).  You have bad smelling fluid coming from your vagina.  You have pain with peeing (urination). Get help right away if:  You have a fever.  You are leaking fluid from your vagina.  You have spotting or bleeding from your vagina.  You have severe belly cramping or pain.  You lose or gain weight rapidly.  You have trouble catching your breath and have chest pain.  You notice sudden or extreme puffiness (swelling) of your face, hands, ankles, feet, or legs.  You have not felt the baby move in over an hour.  You have severe headaches that do not go away with medicine.  You have vision changes. This information is not intended to replace advice given to you by your health care provider. Make   sure you discuss any questions you have with your health care provider. Document Released: 01/21/2010 Document Revised: 04/03/2016 Document Reviewed: 12/28/2012 Elsevier Interactive Patient Education  2017 Elsevier Inc.    

## 2018-08-03 ENCOUNTER — Other Ambulatory Visit: Payer: Self-pay | Admitting: General Practice

## 2018-08-03 DIAGNOSIS — Z3480 Encounter for supervision of other normal pregnancy, unspecified trimester: Secondary | ICD-10-CM

## 2018-08-04 ENCOUNTER — Other Ambulatory Visit: Payer: Medicaid Other

## 2018-08-04 ENCOUNTER — Encounter: Payer: Self-pay | Admitting: Obstetrics and Gynecology

## 2018-08-04 ENCOUNTER — Ambulatory Visit (INDEPENDENT_AMBULATORY_CARE_PROVIDER_SITE_OTHER): Payer: Medicaid Other | Admitting: Obstetrics and Gynecology

## 2018-08-04 VITALS — BP 111/64 | HR 88 | Wt 165.4 lb

## 2018-08-04 DIAGNOSIS — Z789 Other specified health status: Secondary | ICD-10-CM

## 2018-08-04 DIAGNOSIS — Z23 Encounter for immunization: Secondary | ICD-10-CM | POA: Diagnosis not present

## 2018-08-04 DIAGNOSIS — Z3480 Encounter for supervision of other normal pregnancy, unspecified trimester: Secondary | ICD-10-CM

## 2018-08-04 DIAGNOSIS — Z3482 Encounter for supervision of other normal pregnancy, second trimester: Secondary | ICD-10-CM

## 2018-08-04 NOTE — Progress Notes (Signed)
Live Arabic Interpreter- [facility] 

## 2018-08-04 NOTE — Patient Instructions (Signed)

## 2018-08-04 NOTE — Progress Notes (Signed)
   PRENATAL VISIT NOTE  Subjective:  Colleen Sampson is a 22 y.o. G1P0000 at 6978w6d being seen today for ongoing prenatal care.  She is currently monitored for the following issues for this low-risk pregnancy and has Infertility counseling; Supervision of other normal pregnancy, antepartum; and Language barrier on their problem list.  Patient reports no complaints.  Contractions: Not present. Vag. Bleeding: None.  Movement: Present. Denies leaking of fluid.   The following portions of the patient's history were reviewed and updated as appropriate: allergies, current medications, past family history, past medical history, past social history, past surgical history and problem list. Problem list updated.  Objective:   Vitals:   08/04/18 1050  BP: 111/64  Pulse: 88  Weight: 165 lb 6.4 oz (75 kg)    Fetal Status: Fetal Heart Rate (bpm): 138 Fundal Height: 27 cm Movement: Present     General:  Alert, oriented and cooperative. Patient is in no acute distress.  Skin: Skin is warm and dry. No rash noted.   Cardiovascular: Normal heart rate noted  Respiratory: Normal respiratory effort, no problems with respiration noted  Abdomen: Soft, gravid, appropriate for gestational age.  Pain/Pressure: Present     Pelvic: Cervical exam deferred        Extremities: Normal range of motion.  Edema: None  Mental Status: Normal mood and affect. Normal behavior. Normal judgment and thought content.   Assessment and Plan:  Pregnancy: G1P0000 at 878w6d  1. Supervision of other normal pregnancy, antepartum - Tdap vaccine greater than or equal to 7yo IM - Flu Vaccine QUAD 36+ mos IM  2. Language Barrier - In-person Arabic Interpreter - Arna Mediciora - present for today's visit  Preterm labor symptoms and general obstetric precautions including but not limited to vaginal bleeding, contractions, leaking of fluid and fetal movement were reviewed in detail with the patient. Please refer to After Visit Summary for other  counseling recommendations.  Return in about 2 weeks (around 08/18/2018) for Return OB visit.   Raelyn Moraolitta Elvi Leventhal, CNM

## 2018-08-05 LAB — CBC
HEMOGLOBIN: 10.8 g/dL — AB (ref 11.1–15.9)
Hematocrit: 32.7 % — ABNORMAL LOW (ref 34.0–46.6)
MCH: 27 pg (ref 26.6–33.0)
MCHC: 33 g/dL (ref 31.5–35.7)
MCV: 82 fL (ref 79–97)
Platelets: 246 10*3/uL (ref 150–450)
RBC: 4 x10E6/uL (ref 3.77–5.28)
RDW: 13 % (ref 12.3–15.4)
WBC: 7.2 10*3/uL (ref 3.4–10.8)

## 2018-08-05 LAB — HIV ANTIBODY (ROUTINE TESTING W REFLEX): HIV Screen 4th Generation wRfx: NONREACTIVE

## 2018-08-05 LAB — GLUCOSE TOLERANCE, 2 HOURS W/ 1HR
GLUCOSE, 2 HOUR: 82 mg/dL (ref 65–152)
GLUCOSE, FASTING: 74 mg/dL (ref 65–91)
Glucose, 1 hour: 83 mg/dL (ref 65–179)

## 2018-08-05 LAB — RPR: RPR: NONREACTIVE

## 2018-08-12 ENCOUNTER — Encounter: Payer: Medicaid Other | Admitting: Obstetrics and Gynecology

## 2018-08-12 ENCOUNTER — Ambulatory Visit (INDEPENDENT_AMBULATORY_CARE_PROVIDER_SITE_OTHER): Payer: Medicaid Other | Admitting: Obstetrics and Gynecology

## 2018-08-12 DIAGNOSIS — Z3480 Encounter for supervision of other normal pregnancy, unspecified trimester: Secondary | ICD-10-CM

## 2018-08-12 NOTE — Progress Notes (Signed)
.     PRENATAL VISIT NOTE  Subjective:  Colleen Sampson is a 22 y.o. G1P0000 at [redacted]w[redacted]d being seen today for ongoing prenatal care.  She is currently monitored for the following issues for this high-risk pregnancy and has Infertility counseling; Supervision of other normal pregnancy, antepartum; and Language barrier on their problem list.  Patient reports no complaints.  Contractions: Not present. Vag. Bleeding: None.  Movement: Present. Denies leaking of fluid.   The following portions of the patient's history were reviewed and updated as appropriate: allergies, current medications, past family history, past medical history, past social history, past surgical history and problem list. Problem list updated.  Objective:   Vitals:   08/12/18 1509  BP: 109/63  Pulse: 96  Weight: 168 lb 9.6 oz (76.5 kg)    Fetal Status: Fetal Heart Rate (bpm): 147 Fundal Height: 28 cm Movement: Present     General:  Alert, oriented and cooperative. Patient is in no acute distress.  Skin: Skin is warm and dry. No rash noted.   Cardiovascular: Normal heart rate noted  Respiratory: Normal respiratory effort, no problems with respiration noted  Abdomen: Soft, gravid, appropriate for gestational age.  Pain/Pressure: Present     Pelvic: Cervical exam deferred        Extremities: Normal range of motion.  Edema: None  Mental Status: Normal mood and affect. Normal behavior. Normal judgment and thought content.   Assessment and Plan:  Pregnancy: G1P0000 at [redacted]w[redacted]d  1. Supervision of other normal pregnancy, antepartum  - doing well - discussed lab results -Interpretor used for visit  There are no diagnoses linked to this encounter. Preterm labor symptoms and general obstetric precautions including but not limited to vaginal bleeding, contractions, leaking of fluid and fetal movement were reviewed in detail with the patient. Please refer to After Visit Summary for other counseling recommendations.  No follow-ups  on file.  Future Appointments  Date Time Provider Department Center  08/26/2018  3:15 PM Rasch, Harolyn Rutherford, NP [facility]    Venia Carbon, NP

## 2018-08-12 NOTE — Patient Instructions (Signed)
AREA PEDIATRIC/FAMILY PRACTICE PHYSICIANS  [facility] [address], [address] [place], [place]  [address] Phone - [phone]   Fax - [phone]  [facility] [address] [place], [place] [address] Phone - [phone]   Fax - [phone]  [facility] [address] [place], [place]  [address] Phone - [phone]   Fax - [phone]  [facility] [address] [place], [place]  [address] Phone - [phone]   Fax - [phone]  [facility] [address] [place], [place]  [address] Phone - [phone]   Fax - [phone]  [facility] [address] [place], [place]  [address] Phone - [phone]   Fax - [phone]  [facility] [address] [place], [place]  [address] Phone - [phone]   Fax - [phone]  [facility] [address] [place], [place]  [address] Phone - [phone]   Fax - [phone]  [facility] [address] [place], [place]  [address] Phone - [phone]   Fax - [phone] [facility] [address] [place], [place]  [address] Phone - [phone]   Fax - [phone]  [facility] [address] [place], [place]  [address] Phone - [phone]   Fax - [phone]  [facility] [address] [place], [place]  [address] Phone - [phone]   Fax - [phone]  [facility] [address], [address] [place], [place]  [address] Phone - [phone]   Fax - [phone]  [facility]IL[facility] GOSRANI [address] [place], [place]  [address] Phone - [phone]  [facility] [address] [place], [place]  [address] Phone - [phone]   Fax - [phone]  [facility] [address] [place], [place]  [address] Phone - [phone]   Fax - [phone]  [facility] [address] [place], [place]  [address] Phone - [phone]   Fax - [phone]  [facility] [address] [place], [place]  [address] Phone - [phone]   Fax - [phone]   [facility] [address] [place], [place]  [address] Phone - [phone]   Fax - [phone]  [facility] [address] [place], [place]  [address] Phone - [phone]   Fax - [phone]  DA[facility] RUB[facility] [address] [place], [place]  [address] Phone - [phone]   Fax - [phone]  [facility] [address] , [place]  [address] Phone - [phone]   Fax - [phone]  [facility] [address] , [place]  [address] Phone - [phone]   Fax - [phone] [facility] [address] [place], [place]  [address] Phone - [phone]   Fax - [phone]  [facility] [address] [place], [place]  [address] Phone - [phone]   Fax - [phone]  [facility] [address] [place],   [address] Phone - [phone]   Fax - [phone]  [facility] Charlene Flemming MD [address]   [address] Phone [phone]  Fax [phone]   

## 2018-08-26 ENCOUNTER — Ambulatory Visit (INDEPENDENT_AMBULATORY_CARE_PROVIDER_SITE_OTHER): Payer: Medicaid Other | Admitting: Obstetrics and Gynecology

## 2018-08-26 DIAGNOSIS — Z3480 Encounter for supervision of other normal pregnancy, unspecified trimester: Secondary | ICD-10-CM

## 2018-08-26 NOTE — Patient Instructions (Signed)
Contraception Choices Contraception, also called birth control, refers to methods or devices that prevent pregnancy. Hormonal methods Contraceptive implant A contraceptive implant is a thin, plastic tube that contains a hormone. It is inserted into the upper part of the arm. It can remain in place for up to 3 years. Progestin-only injections Progestin-only injections are injections of progestin, a synthetic form of the hormone progesterone. They are given every 3 months by a health care provider. Birth control pills Birth control pills are pills that contain hormones that prevent pregnancy. They must be taken once a day, preferably at the same time each day. Birth control patch The birth control patch contains hormones that prevent pregnancy. It is placed on the skin and must be changed once a week for three weeks and removed on the fourth week. A prescription is needed to use this method of contraception. Vaginal ring A vaginal ring contains hormones that prevent pregnancy. It is placed in the vagina for three weeks and removed on the fourth week. After that, the process is repeated with a new ring. A prescription is needed to use this method of contraception. Emergency contraceptive Emergency contraceptives prevent pregnancy after unprotected sex. They come in pill form and can be taken up to 5 days after sex. They work best the sooner they are taken after having sex. Most emergency contraceptives are available without a prescription. This method should not be used as your only form of birth control. Barrier methods Female condom A female condom is a thin sheath that is worn over the penis during sex. Condoms keep sperm from going inside a woman's body. They can be used with a spermicide to increase their effectiveness. They should be disposed after a single use. Female condom A female condom is a soft, loose-fitting sheath that is put into the vagina before sex. The condom keeps sperm from going  inside a woman's body. They should be disposed after a single use. Diaphragm A diaphragm is a soft, dome-shaped barrier. It is inserted into the vagina before sex, along with a spermicide. The diaphragm blocks sperm from entering the uterus, and the spermicide kills sperm. A diaphragm should be left in the vagina for 6-8 hours after sex and removed within 24 hours. A diaphragm is prescribed and fitted by a health care provider. A diaphragm should be replaced every 1-2 years, after giving birth, after gaining more than 15 lb (6.8 kg), and after pelvic surgery. Cervical cap A cervical cap is a round, soft latex or plastic cup that fits over the cervix. It is inserted into the vagina before sex, along with spermicide. It blocks sperm from entering the uterus. The cap should be left in place for 6-8 hours after sex and removed within 48 hours. A cervical cap must be prescribed and fitted by a health care provider. It should be replaced every 2 years. Sponge A sponge is a soft, circular piece of polyurethane foam with spermicide on it. The sponge helps block sperm from entering the uterus, and the spermicide kills sperm. To use it, you make it wet and then insert it into the vagina. It should be inserted before sex, left in for at least 6 hours after sex, and removed and thrown away within 30 hours. Spermicides Spermicides are chemicals that kill or block sperm from entering the cervix and uterus. They can come as a cream, jelly, suppository, foam, or tablet. A spermicide should be inserted into the vagina with an applicator at least 10-15 minutes before   sex to allow time for it to work. The process must be repeated every time you have sex. Spermicides do not require a prescription. Intrauterine contraception Intrauterine device (IUD) An IUD is a T-shaped device that is put in a woman's uterus. There are two types:  Hormone IUD.This type contains progestin, a synthetic form of the hormone progesterone. This  type can stay in place for 3-5 years.  Copper IUD.This type is wrapped in copper wire. It can stay in place for 10 years.  Permanent methods of contraception Female tubal ligation In this method, a woman's fallopian tubes are sealed, tied, or blocked during surgery to prevent eggs from traveling to the uterus. Hysteroscopic sterilization In this method, a small, flexible insert is placed into each fallopian tube. The inserts cause scar tissue to form in the fallopian tubes and block them, so sperm cannot reach an egg. The procedure takes about 3 months to be effective. Another form of birth control must be used during those 3 months. Female sterilization This is a procedure to tie off the tubes that carry sperm (vasectomy). After the procedure, the man can still ejaculate fluid (semen). Natural planning methods Natural family planning In this method, a couple does not have sex on days when the woman could become pregnant. Calendar method This means keeping track of the length of each menstrual cycle, identifying the days when pregnancy can happen, and not having sex on those days. Ovulation method In this method, a couple avoids sex during ovulation. Symptothermal method This method involves not having sex during ovulation. The woman typically checks for ovulation by watching changes in her temperature and in the consistency of cervical mucus. Post-ovulation method In this method, a couple waits to have sex until after ovulation. Summary  Contraception, also called birth control, means methods or devices that prevent pregnancy.  Hormonal methods of contraception include implants, injections, pills, patches, vaginal rings, and emergency contraceptives.  Barrier methods of contraception can include female condoms, female condoms, diaphragms, cervical caps, sponges, and spermicides.  There are two types of IUDs (intrauterine devices). An IUD can be put in a woman's uterus to prevent pregnancy  for 3-5 years.  Permanent sterilization can be done through a procedure for males, females, or both.  Natural family planning methods involve not having sex on days when the woman could become pregnant. This information is not intended to replace advice given to you by your health care provider. Make sure you discuss any questions you have with your health care provider. Document Released: 10/27/2005 Document Revised: 11/29/2016 Document Reviewed: 11/29/2016 Elsevier Interactive Patient Education  2018 Elsevier Inc.  

## 2018-08-26 NOTE — Progress Notes (Signed)
   PRENATAL VISIT NOTE  Subjective:  Colleen Sampson is a 22 y.o. G1P0000 at [redacted]w[redacted]d being seen today for ongoing prenatal care.  She is currently monitored for the following issues for this low-risk pregnancy and has Infertility counseling; Supervision of other normal pregnancy, antepartum; and Language barrier on their problem list.  Patient reports no complaints.  Contractions: Not present. Vag. Bleeding: None.  Movement: Present. Denies leaking of fluid.   The following portions of the patient's history were reviewed and updated as appropriate: allergies, current medications, past family history, past medical history, past social history, past surgical history and problem list. Problem list updated.  Objective:   Vitals:   08/26/18 1529  BP: 106/70  Pulse: 90  Weight: 168 lb 6.4 oz (76.4 kg)    Fetal Status: Fetal Heart Rate (bpm): 158 Fundal Height: 30 cm Movement: Present     General:  Alert, oriented and cooperative. Patient is in no acute distress.  Skin: Skin is warm and dry. No rash noted.   Cardiovascular: Normal heart rate noted  Respiratory: Normal respiratory effort, no problems with respiration noted  Abdomen: Soft, gravid, appropriate for gestational age.  Pain/Pressure: Present     Pelvic: Cervical exam deferred        Extremities: Normal range of motion.  Edema: None  Mental Status: Normal mood and affect. Normal behavior. Normal judgment and thought content.   Assessment and Plan:  Pregnancy: G1P0000 at [redacted]w[redacted]d  1. Supervision of other normal pregnancy, antepartum  - 2 hour GTT normal -Doing well.  There are no diagnoses linked to this encounter. Preterm labor symptoms and general obstetric precautions including but not limited to vaginal bleeding, contractions, leaking of fluid and fetal movement were reviewed in detail with the patient. Please refer to After Visit Summary for other counseling recommendations.  Return in about 2 weeks (around 09/09/2018).  No  future appointments.  Venia Carbon, NP

## 2018-09-08 ENCOUNTER — Encounter: Payer: Self-pay | Admitting: Family Medicine

## 2018-09-08 ENCOUNTER — Ambulatory Visit (INDEPENDENT_AMBULATORY_CARE_PROVIDER_SITE_OTHER): Payer: Medicaid Other | Admitting: Family Medicine

## 2018-09-08 VITALS — BP 97/60 | HR 86 | Wt 168.0 lb

## 2018-09-08 DIAGNOSIS — Z789 Other specified health status: Secondary | ICD-10-CM

## 2018-09-08 DIAGNOSIS — Z3480 Encounter for supervision of other normal pregnancy, unspecified trimester: Secondary | ICD-10-CM

## 2018-09-08 NOTE — Patient Instructions (Addendum)

## 2018-09-08 NOTE — Progress Notes (Signed)
   PRENATAL VISIT NOTE *Offered video interpreter, patient declined and husband served as interpreter* Subjective:  Colleen Sampson is a 22 y.o. G1P0000 at [redacted]w[redacted]d being seen today for ongoing prenatal care.  She is currently monitored for the following issues for this low-risk pregnancy and has Supervision of other normal pregnancy, antepartum and Language barrier on their problem list.  - birth control undecided, has never taken before  - circumcision planning outpatient - pediatrician undecided - taking amoxicillin until root canel, taking Tylenol for pain   Patient reports no complaints.  Contractions: Not present. Vag. Bleeding: None.  Movement: Present. Denies leaking of fluid.   The following portions of the patient's history were reviewed and updated as appropriate: allergies, current medications, past family history, past medical history, past social history, past surgical history and problem list. Problem list updated.  Objective:   Vitals:   09/08/18 1334  BP: 97/60  Pulse: 86  Weight: 168 lb (76.2 kg)    Fetal Status: Fetal Heart Rate (bpm): 152 Fundal Height: 31 cm Movement: Present     General:  Alert, oriented and cooperative. Patient is in no acute distress.  Skin: Skin is warm and dry. No rash noted.   Cardiovascular: Normal heart rate noted  Respiratory: Normal respiratory effort, no problems with respiration noted  Abdomen: Soft, gravid, appropriate for gestational age.  Pain/Pressure: Absent     Pelvic: Cervical exam deferred        Extremities: Normal range of motion.  Edema: None  Mental Status: Normal mood and affect. Normal behavior. Normal judgment and thought content.   Assessment and Plan:  Pregnancy: G1P0000 at [redacted]w[redacted]d  1. Supervision of other normal pregnancy, antepartum -- prenatal care reviewed and UTD -- counseling on contraception, circumcision   Preterm labor symptoms and general obstetric precautions including but not limited to vaginal bleeding,  contractions, leaking of fluid and fetal movement were reviewed in detail with the patient. Please refer to After Visit Summary for other counseling recommendations.  Return in about 2 weeks (around 09/22/2018) for LROB .  Future Appointments  Date Time Provider Department Center  09/22/2018  3:35 PM Burleson, Brand Males, NP [facility]   Tamera Stands, DO

## 2018-09-22 ENCOUNTER — Encounter: Payer: Medicaid Other | Admitting: Nurse Practitioner

## 2018-09-23 ENCOUNTER — Ambulatory Visit (INDEPENDENT_AMBULATORY_CARE_PROVIDER_SITE_OTHER): Payer: Medicaid Other | Admitting: Obstetrics and Gynecology

## 2018-09-23 VITALS — BP 109/68 | HR 95 | Wt 171.1 lb

## 2018-09-23 DIAGNOSIS — Z3480 Encounter for supervision of other normal pregnancy, unspecified trimester: Secondary | ICD-10-CM

## 2018-09-23 DIAGNOSIS — Z789 Other specified health status: Secondary | ICD-10-CM

## 2018-09-23 DIAGNOSIS — Z3483 Encounter for supervision of other normal pregnancy, third trimester: Secondary | ICD-10-CM

## 2018-09-23 NOTE — Progress Notes (Signed)
   PRENATAL VISIT NOTE  Subjective:  Colleen Sampson is a 22 y.o. G1P0000 at 5477w0d being seen today for ongoing prenatal care.  She is currently monitored for the following issues for this low-risk pregnancy and has Supervision of other normal pregnancy, antepartum and Language barrier on their problem list.  Patient reports no complaints.  Contractions: Not present. Vag. Bleeding: None.  Movement: Present. Denies leaking of fluid.   The following portions of the patient's history were reviewed and updated as appropriate: allergies, current medications, past family history, past medical history, past social history, past surgical history and problem list. Problem list updated.  Objective:   Vitals:   09/23/18 1546  BP: 109/68  Pulse: 95  Weight: 171 lb 1.6 oz (77.6 kg)    Fetal Status: Fetal Heart Rate (bpm): 154 Fundal Height: 35 cm Movement: Present     General:  Alert, oriented and cooperative. Patient is in no acute distress.  Skin: Skin is warm and dry. No rash noted.   Cardiovascular: Normal heart rate noted  Respiratory: Normal respiratory effort, no problems with respiration noted  Abdomen: Soft, gravid, appropriate for gestational age.  Pain/Pressure: Present     Pelvic: Cervical exam deferred        Extremities: Normal range of motion.  Edema: None  Mental Status: Normal mood and affect. Normal behavior. Normal judgment and thought content.   Assessment and Plan:  Pregnancy: G1P0000 at 5977w0d  1. Supervision of other normal pregnancy, antepartum  - Doing well today - GBS next visit   2. Language barrier  - Live interpretor present.    There are no diagnoses linked to this encounter. Preterm labor symptoms and general obstetric precautions including but not limited to vaginal bleeding, contractions, leaking of fluid and fetal movement were reviewed in detail with the patient. Please refer to After Visit Summary for other counseling recommendations.  Return in about  1 week (around 09/30/2018).  No future appointments.  Venia CarbonJennifer Amyjo Mizrachi, NP

## 2018-09-23 NOTE — Progress Notes (Signed)
Live Clinical research associateArabic Interpreter- [facility]

## 2018-10-04 ENCOUNTER — Other Ambulatory Visit (HOSPITAL_COMMUNITY)
Admission: RE | Admit: 2018-10-04 | Discharge: 2018-10-04 | Disposition: A | Discharge status: Home / Self Care | Payer: Medicaid Other | Source: Ambulatory Visit | Attending: Advanced Practice Midwife | Admitting: Advanced Practice Midwife

## 2018-10-04 ENCOUNTER — Ambulatory Visit (INDEPENDENT_AMBULATORY_CARE_PROVIDER_SITE_OTHER): Payer: Medicaid Other | Admitting: Advanced Practice Midwife

## 2018-10-04 ENCOUNTER — Encounter: Payer: Self-pay | Admitting: Advanced Practice Midwife

## 2018-10-04 VITALS — BP 105/66 | HR 83 | Wt 169.2 lb

## 2018-10-04 DIAGNOSIS — Z3480 Encounter for supervision of other normal pregnancy, unspecified trimester: Secondary | ICD-10-CM | POA: Insufficient documentation

## 2018-10-04 DIAGNOSIS — Z3A36 36 weeks gestation of pregnancy: Secondary | ICD-10-CM

## 2018-10-04 DIAGNOSIS — Z3483 Encounter for supervision of other normal pregnancy, third trimester: Secondary | ICD-10-CM

## 2018-10-04 LAB — OB RESULTS CONSOLE GC/CHLAMYDIA: Gonorrhea: NEGATIVE

## 2018-10-04 LAB — OB RESULTS CONSOLE GBS: GBS: POSITIVE

## 2018-10-04 NOTE — Progress Notes (Signed)
   PRENATAL VISIT NOTE  Subjective:  Colleen Sampson is a 22 y.o. G1P0000 at 4119w4d being seen today for ongoing prenatal care.  She is currently monitored for the following issues for this low-risk pregnancy and has Supervision of other normal pregnancy, antepartum and Language barrier on their problem list.  Patient reports no complaints.  Contractions: Not present. Vag. Bleeding: None.  Movement: Present. Denies leaking of fluid.   The following portions of the patient's history were reviewed and updated as appropriate: allergies, current medications, past family history, past medical history, past social history, past surgical history and problem list. Problem list updated.  Objective:   Vitals:   10/04/18 0830  BP: 105/66  Pulse: 83  Weight: 169 lb 3.2 oz (76.7 kg)    Fetal Status: Fetal Heart Rate (bpm): 156 Fundal Height: 35 cm Movement: Present     General:  Alert, oriented and cooperative. Patient is in no acute distress.  Skin: Skin is warm and dry. No rash noted.   Cardiovascular: Normal heart rate noted  Respiratory: Normal respiratory effort, no problems with respiration noted  Abdomen: Soft, gravid, appropriate for gestational age.  Pain/Pressure: Present     Pelvic: Cervical exam deferred        Extremities: Normal range of motion.  Edema: None  Mental Status: Normal mood and affect. Normal behavior. Normal judgment and thought content.   Assessment and Plan:  Pregnancy: G1P0000 at 3719w4d  1. Supervision of other normal pregnancy, antepartum - Routine care - Culture, beta strep (group b only) - Cervicovaginal ancillary only( [facility]) - Have all necessary baby supplies.  - Still undecided on pediatrician, but they have one in mind.  - Discussed contraception today. Undecided. Information given  - Watch FH at next visit   Term labor symptoms and general obstetric precautions including but not limited to vaginal bleeding, contractions, leaking of fluid and fetal  movement were reviewed in detail with the patient. Please refer to After Visit Summary for other counseling recommendations.  Return in about 1 week (around 10/11/2018).  No future appointments.  Thressa ShellerHeather Richard Holz, CNM

## 2018-10-04 NOTE — Patient Instructions (Addendum)
Contraception Choices Contraception, also called birth control, refers to methods or devices that prevent pregnancy. Hormonal methods Contraceptive implant A contraceptive implant is a thin, plastic tube that contains a hormone. It is inserted into the upper part of the arm. It can remain in place for up to 3 years. Progestin-only injections Progestin-only injections are injections of progestin, a synthetic form of the hormone progesterone. They are given every 3 months by a health care provider. Birth control pills Birth control pills are pills that contain hormones that prevent pregnancy. They must be taken once a day, preferably at the same time each day. Birth control patch The birth control patch contains hormones that prevent pregnancy. It is placed on the skin and must be changed once a week for three weeks and removed on the fourth week. A prescription is needed to use this method of contraception. Vaginal ring A vaginal ring contains hormones that prevent pregnancy. It is placed in the vagina for three weeks and removed on the fourth week. After that, the process is repeated with a new ring. A prescription is needed to use this method of contraception. Emergency contraceptive Emergency contraceptives prevent pregnancy after unprotected sex. They come in pill form and can be taken up to 5 days after sex. They work best the sooner they are taken after having sex. Most emergency contraceptives are available without a prescription. This method should not be used as your only form of birth control. Barrier methods Female condom A female condom is a thin sheath that is worn over the penis during sex. Condoms keep sperm from going inside a woman's body. They can be used with a spermicide to increase their effectiveness. They should be disposed after a single use. Female condom A female condom is a soft, loose-fitting sheath that is put into the vagina before sex. The condom keeps sperm from going  inside a woman's body. They should be disposed after a single use. Diaphragm A diaphragm is a soft, dome-shaped barrier. It is inserted into the vagina before sex, along with a spermicide. The diaphragm blocks sperm from entering the uterus, and the spermicide kills sperm. A diaphragm should be left in the vagina for 6-8 hours after sex and removed within 24 hours. A diaphragm is prescribed and fitted by a health care provider. A diaphragm should be replaced every 1-2 years, after giving birth, after gaining more than 15 lb (6.8 kg), and after pelvic surgery. Cervical cap A cervical cap is a round, soft latex or plastic cup that fits over the cervix. It is inserted into the vagina before sex, along with spermicide. It blocks sperm from entering the uterus. The cap should be left in place for 6-8 hours after sex and removed within 48 hours. A cervical cap must be prescribed and fitted by a health care provider. It should be replaced every 2 years. Sponge A sponge is a soft, circular piece of polyurethane foam with spermicide on it. The sponge helps block sperm from entering the uterus, and the spermicide kills sperm. To use it, you make it wet and then insert it into the vagina. It should be inserted before sex, left in for at least 6 hours after sex, and removed and thrown away within 30 hours. Spermicides Spermicides are chemicals that kill or block sperm from entering the cervix and uterus. They can come as a cream, jelly, suppository, foam, or tablet. A spermicide should be inserted into the vagina with an applicator at least 10-15 minutes before   sex to allow time for it to work. The process must be repeated every time you have sex. Spermicides do not require a prescription. Intrauterine contraception Intrauterine device (IUD) An IUD is a T-shaped device that is put in a woman's uterus. There are two types:  Hormone IUD.This type contains progestin, a synthetic form of the hormone progesterone. This  type can stay in place for 3-5 years.  Copper IUD.This type is wrapped in copper wire. It can stay in place for 10 years.  Permanent methods of contraception Female tubal ligation In this method, a woman's fallopian tubes are sealed, tied, or blocked during surgery to prevent eggs from traveling to the uterus. Hysteroscopic sterilization In this method, a small, flexible insert is placed into each fallopian tube. The inserts cause scar tissue to form in the fallopian tubes and block them, so sperm cannot reach an egg. The procedure takes about 3 months to be effective. Another form of birth control must be used during those 3 months. Female sterilization This is a procedure to tie off the tubes that carry sperm (vasectomy). After the procedure, the man can still ejaculate fluid (semen). Natural planning methods Natural family planning In this method, a couple does not have sex on days when the woman could become pregnant. Calendar method This means keeping track of the length of each menstrual cycle, identifying the days when pregnancy can happen, and not having sex on those days. Ovulation method In this method, a couple avoids sex during ovulation. Symptothermal method This method involves not having sex during ovulation. The woman typically checks for ovulation by watching changes in her temperature and in the consistency of cervical mucus. Post-ovulation method In this method, a couple waits to have sex until after ovulation. Summary  Contraception, also called birth control, means methods or devices that prevent pregnancy.  Hormonal methods of contraception include implants, injections, pills, patches, vaginal rings, and emergency contraceptives.  Barrier methods of contraception can include female condoms, female condoms, diaphragms, cervical caps, sponges, and spermicides.  There are two types of IUDs (intrauterine devices). An IUD can be put in a woman's uterus to prevent pregnancy  for 3-5 years.  Permanent sterilization can be done through a procedure for males, females, or both.  Natural family planning methods involve not having sex on days when the woman could become pregnant. This information is not intended to replace advice given to you by your health care provider. Make sure you discuss any questions you have with your health care provider. Document Released: 10/27/2005 Document Revised: 11/29/2016 Document Reviewed: 11/29/2016 Elsevier Interactive Patient Education  2018 Elsevier Inc.  AREA PEDIATRIC/FAMILY PRACTICE PHYSICIANS  [facility] [address] [place], [place]  [address] Phone - [phone]   Fax - [phone]  [facility] [address] [place], [place] [address] Phone - [phone]   Fax - [phone]  JACK AMOS [address] [place], [place]  [address] Phone - [phone]   Fax - [phone]  [facility] [address] [place], [place]  [address] Phone - [phone]   Fax - [phone]  [facility] [address] [place], [place]  [address] Phone - [phone]   Fax - [phone]  [facility] [address] [place], [place]  [address] Phone - [phone]   Fax - [phone]  [facility] [place] [address] , [place]  [address] Phone - [phone]     Fax - [phone]  [facility] [address] [place], [place]  [address] Phone - [phone]   Fax - [phone]  [facility] [address] [place], [place]  [address] Phone - [phone]   Fax - [phone] [facility] [address] [place], [place]  [address] Phone - [phone]   Fax - [phone]  [facility] [address] [place], [place]  [address] Phone - [phone]   Fax -  [phone]  [facility] [address] [place], [place]  [address] Phone - [phone]   Fax - [phone]  [facility] [address] [place], [place]  [address] Phone - [phone]   Fax - [phone]  SHILPA GOSRANI [address] [place], [place]  [address] Phone - [phone]  [facility] [address] [place], [place]  [address] Phone - [phone]   Fax - [phone]  [facility] [address] [place], [place]  [address] Phone - [phone]   Fax - [phone]  [facility] [address] [place], [place]  [address] Phone - [phone]   Fax - [phone]  [facility] [address] [place], [place]  [address] Phone - [phone]   Fax - [phone]   [facility] [address] [place], [place]  [address] Phone - [phone]   Fax - [phone]  [facility] [address] [place], [place]  [address] Phone - [phone]   Fax - [phone]  DAVID RUBIN [address] [place], [place]  [address] Phone - [phone]   Fax - [phone]  [facility] [address] [place], [place]  [address] Phone - [phone]   Fax - [phone]  [facility] [address] , [place]  [address] Phone - [phone]   Fax - [phone] [facility] [address] [place], [place]  [address] Phone - [phone]   Fax - [phone]  [facility] [address] [place], [place]  [address] Phone - [phone]   Fax - [phone]  [facility] [address] [place], [place]  [address] Phone - [phone]   Fax - [phone]  [facility] [place] Charlene Flemming MD [address] [place]  [address] Phone  [phone]  Fax [phone]  

## 2018-10-05 LAB — CERVICOVAGINAL ANCILLARY ONLY
CHLAMYDIA, DNA PROBE: NEGATIVE
Neisseria Gonorrhea: NEGATIVE

## 2018-10-06 ENCOUNTER — Encounter: Payer: Self-pay | Admitting: Family Medicine

## 2018-10-07 LAB — CULTURE, BETA STREP (GROUP B ONLY): STREP GP B CULTURE: POSITIVE — AB

## 2018-10-11 ENCOUNTER — Ambulatory Visit (INDEPENDENT_AMBULATORY_CARE_PROVIDER_SITE_OTHER): Payer: Medicaid Other | Admitting: Advanced Practice Midwife

## 2018-10-11 ENCOUNTER — Encounter: Payer: Self-pay | Admitting: Advanced Practice Midwife

## 2018-10-11 VITALS — BP 108/69 | HR 95 | Wt 169.7 lb

## 2018-10-11 DIAGNOSIS — Z3480 Encounter for supervision of other normal pregnancy, unspecified trimester: Secondary | ICD-10-CM

## 2018-10-11 DIAGNOSIS — O26843 Uterine size-date discrepancy, third trimester: Secondary | ICD-10-CM

## 2018-10-11 DIAGNOSIS — Z3483 Encounter for supervision of other normal pregnancy, third trimester: Secondary | ICD-10-CM

## 2018-10-11 NOTE — Patient Instructions (Signed)
Contraception Choices Contraception, also called birth control, refers to methods or devices that prevent pregnancy. Hormonal methods Contraceptive implant A contraceptive implant is a thin, plastic tube that contains a hormone. It is inserted into the upper part of the arm. It can remain in place for up to 3 years. Progestin-only injections Progestin-only injections are injections of progestin, a synthetic form of the hormone progesterone. They are given every 3 months by a health care provider. Birth control pills Birth control pills are pills that contain hormones that prevent pregnancy. They must be taken once a day, preferably at the same time each day. Birth control patch The birth control patch contains hormones that prevent pregnancy. It is placed on the skin and must be changed once a week for three weeks and removed on the fourth week. A prescription is needed to use this method of contraception. Vaginal ring A vaginal ring contains hormones that prevent pregnancy. It is placed in the vagina for three weeks and removed on the fourth week. After that, the process is repeated with a new ring. A prescription is needed to use this method of contraception. Emergency contraceptive Emergency contraceptives prevent pregnancy after unprotected sex. They come in pill form and can be taken up to 5 days after sex. They work best the sooner they are taken after having sex. Most emergency contraceptives are available without a prescription. This method should not be used as your only form of birth control. Barrier methods Female condom A female condom is a thin sheath that is worn over the penis during sex. Condoms keep sperm from going inside a woman's body. They can be used with a spermicide to increase their effectiveness. They should be disposed after a single use. Female condom A female condom is a soft, loose-fitting sheath that is put into the vagina before sex. The condom keeps sperm from going  inside a woman's body. They should be disposed after a single use. Diaphragm A diaphragm is a soft, dome-shaped barrier. It is inserted into the vagina before sex, along with a spermicide. The diaphragm blocks sperm from entering the uterus, and the spermicide kills sperm. A diaphragm should be left in the vagina for 6-8 hours after sex and removed within 24 hours. A diaphragm is prescribed and fitted by a health care provider. A diaphragm should be replaced every 1-2 years, after giving birth, after gaining more than 15 lb (6.8 kg), and after pelvic surgery. Cervical cap A cervical cap is a round, soft latex or plastic cup that fits over the cervix. It is inserted into the vagina before sex, along with spermicide. It blocks sperm from entering the uterus. The cap should be left in place for 6-8 hours after sex and removed within 48 hours. A cervical cap must be prescribed and fitted by a health care provider. It should be replaced every 2 years. Sponge A sponge is a soft, circular piece of polyurethane foam with spermicide on it. The sponge helps block sperm from entering the uterus, and the spermicide kills sperm. To use it, you make it wet and then insert it into the vagina. It should be inserted before sex, left in for at least 6 hours after sex, and removed and thrown away within 30 hours. Spermicides Spermicides are chemicals that kill or block sperm from entering the cervix and uterus. They can come as a cream, jelly, suppository, foam, or tablet. A spermicide should be inserted into the vagina with an applicator at least 10-15 minutes before   sex to allow time for it to work. The process must be repeated every time you have sex. Spermicides do not require a prescription. Intrauterine contraception Intrauterine device (IUD) An IUD is a T-shaped device that is put in a woman's uterus. There are two types:  Hormone IUD.This type contains progestin, a synthetic form of the hormone progesterone. This  type can stay in place for 3-5 years.  Copper IUD.This type is wrapped in copper wire. It can stay in place for 10 years.  Permanent methods of contraception Female tubal ligation In this method, a woman's fallopian tubes are sealed, tied, or blocked during surgery to prevent eggs from traveling to the uterus. Hysteroscopic sterilization In this method, a small, flexible insert is placed into each fallopian tube. The inserts cause scar tissue to form in the fallopian tubes and block them, so sperm cannot reach an egg. The procedure takes about 3 months to be effective. Another form of birth control must be used during those 3 months. Female sterilization This is a procedure to tie off the tubes that carry sperm (vasectomy). After the procedure, the man can still ejaculate fluid (semen). Natural planning methods Natural family planning In this method, a couple does not have sex on days when the woman could become pregnant. Calendar method This means keeping track of the length of each menstrual cycle, identifying the days when pregnancy can happen, and not having sex on those days. Ovulation method In this method, a couple avoids sex during ovulation. Symptothermal method This method involves not having sex during ovulation. The woman typically checks for ovulation by watching changes in her temperature and in the consistency of cervical mucus. Post-ovulation method In this method, a couple waits to have sex until after ovulation. Summary  Contraception, also called birth control, means methods or devices that prevent pregnancy.  Hormonal methods of contraception include implants, injections, pills, patches, vaginal rings, and emergency contraceptives.  Barrier methods of contraception can include female condoms, female condoms, diaphragms, cervical caps, sponges, and spermicides.  There are two types of IUDs (intrauterine devices). An IUD can be put in a woman's uterus to prevent pregnancy  for 3-5 years.  Permanent sterilization can be done through a procedure for males, females, or both.  Natural family planning methods involve not having sex on days when the woman could become pregnant. This information is not intended to replace advice given to you by your health care provider. Make sure you discuss any questions you have with your health care provider. Document Released: 10/27/2005 Document Revised: 11/29/2016 Document Reviewed: 11/29/2016 Elsevier Interactive Patient Education  2018 [place] [address] [place], [place]  [address] Phone - [phone]   Fax - [phone]  [facility] [address] [place], [place] [address] Phone - [phone]   Fax - [place] [address] [place], [place]  [address] Phone - [phone]   Fax - [phone]  [place] [address] [place], [place]  [address] Phone - [phone]   Fax - [phone]  [place] [address] [place], [place]  [address] Phone - [phone]   Fax - [phone]  [facility] [address] [place], [place]  [address] Phone - [phone]   Fax - [place] [address], [place], [place]  [address] Phone - [phone]  Fax - [phone]  [facility] [address] [place], [address] [place], [place]  [address] Phone - [phone]   Fax - [phone]  [facility] [address] [place], [place]  [address] Phone - [phone]   Fax - [phone] [facility] [place] [address] [place], [place]  [address] Phone - [phone]   Fax - [phone]  [facility] [address] [place], [place]  [address] Phone - [phone]   Fax -  [phone]  [facility] [address] [place], [place]  [address] Phone - [phone]   Fax - [phone]  [facility] [address] [place], [place]  [address] Phone - [phone]   Fax - [phone]  [facility] [address] [place], [place]  [address] Phone - [phone]  [facility] [address] [place], [place]  [address] Phone - [phone]   Fax - [phone]  [facility] [address] [place], [place]  [address] Phone - [phone]   Fax - [phone]  [facility] [address] [place], [place]  [address] Phone - [phone]   Fax - [phone]  [facility] [address] [place], [place]  [address] Phone - [phone]   Fax - [phone]   [facility] [address] [place] [address] [place], [place]  [address] Phone - [phone]   Fax - [phone]  [facility] [address] [place], [place]  [address] Phone - [phone]   Fax - [phone]  DAVID RUBIN [address] [place], [place]  [address] Phone - [phone]   Fax - [phone]  [facility] [address] [place], [place]  [address] Phone - [phone]   Fax - [phone]  [place] [facility] Alita Chyle [address] [place], [place]  [address] Phone - [phone]   Fax - [phone] Gerarda Fraction [address] [place], [place]  [address] Phone - [phone]   Fax - [phone]  [facility] [address] [place], [place]  [address] Phone - [phone]   Fax - [phone]  [facility] [place] [address] [place], [place]  [address] Phone - [phone]   Fax - [phone]  [facility] [place] Wyvonne Lenz MD [address] [place] [address] Phone  [phone]  Fax [phone]

## 2018-10-11 NOTE — Progress Notes (Signed)
   PRENATAL VISIT NOTE  Subjective:  Colleen Sampson is a 22 y.o. G1P0000 at 4381w4d being seen today for ongoing prenatal care.  She is currently monitored for the following issues for this low-risk pregnancy and has Supervision of other normal pregnancy, antepartum and Language barrier on their problem list.  Patient reports no complaints.  Contractions: Not present. Vag. Bleeding: None.  Movement: Present. Denies leaking of fluid.   The following portions of the patient's history were reviewed and updated as appropriate: allergies, current medications, past family history, past medical history, past social history, past surgical history and problem list. Problem list updated.  Objective:   Vitals:   10/11/18 1520  BP: 108/69  Pulse: 95  Weight: 169 lb 11.2 oz (77 kg)    Fetal Status: Fetal Heart Rate (bpm): 141 Fundal Height: 35 cm Movement: Present     General:  Alert, oriented and cooperative. Patient is in no acute distress.  Skin: Skin is warm and dry. No rash noted.   Cardiovascular: Normal heart rate noted  Respiratory: Normal respiratory effort, no problems with respiration noted  Abdomen: Soft, gravid, appropriate for gestational age.  Pain/Pressure: Present     Pelvic: Cervical exam deferred        Extremities: Normal range of motion.  Edema: None  Mental Status: Normal mood and affect. Normal behavior. Normal judgment and thought content.   Assessment and Plan:  Pregnancy: G1P0000 at 4081w4d  1. Supervision of other normal pregnancy, antepartum - Routine care  2. Size less than dates again today - Will get FU growth US    Preterm labor symptoms and general obstetric precautions including but not limited to vaginal bleeding, contractions, leaking of fluid and fetal movement were reviewed in detail with the patient. Please refer to After Visit Summary for other counseling recommendations.  Return in about 1 week (around 10/18/2018).  Future Appointments  Date Time  Provider Department Center  10/19/2018  8:15 AM [facility] US 4 [facility]US M[facility]-US  10/19/2018  9:55 AM Marylene LandKooistra, Kathryn Lorraine, CNM [facility]  10/22/2018 10:15 AM Judeth HornLawrence, Erin, NP [facility]  10/29/2018  8:35 AM Allie Bossierove, Myra C, MD [facility]    Thressa ShellerHeather Aryani Daffern, CNM

## 2018-10-11 NOTE — Progress Notes (Signed)
Ultrasound scheduled for 12/10 @ 8:15. Pt made aware of appointment and time.

## 2018-10-19 ENCOUNTER — Ambulatory Visit (HOSPITAL_COMMUNITY)
Admission: RE | Admit: 2018-10-19 | Discharge: 2018-10-19 | Disposition: A | Discharge status: Home / Self Care | Payer: Medicaid Other | Source: Ambulatory Visit | Attending: Advanced Practice Midwife | Admitting: Advanced Practice Midwife

## 2018-10-19 ENCOUNTER — Other Ambulatory Visit: Payer: Self-pay | Admitting: Student

## 2018-10-19 ENCOUNTER — Ambulatory Visit (INDEPENDENT_AMBULATORY_CARE_PROVIDER_SITE_OTHER): Payer: Medicaid Other | Admitting: Student

## 2018-10-19 DIAGNOSIS — Z362 Encounter for other antenatal screening follow-up: Secondary | ICD-10-CM | POA: Insufficient documentation

## 2018-10-19 DIAGNOSIS — Z3480 Encounter for supervision of other normal pregnancy, unspecified trimester: Secondary | ICD-10-CM

## 2018-10-19 DIAGNOSIS — Z3A38 38 weeks gestation of pregnancy: Secondary | ICD-10-CM | POA: Diagnosis not present

## 2018-10-19 DIAGNOSIS — O26843 Uterine size-date discrepancy, third trimester: Secondary | ICD-10-CM | POA: Insufficient documentation

## 2018-10-19 DIAGNOSIS — Z3483 Encounter for supervision of other normal pregnancy, third trimester: Secondary | ICD-10-CM

## 2018-10-19 MED ORDER — PENICILLIN G POTASSIUM 5000000 UNITS IJ SOLR: 2.5000 10*6.[IU] | INTRAVENOUS | Status: DC

## 2018-10-19 MED ORDER — SODIUM CHLORIDE 0.9 % IV SOLN: 5.0000 10*6.[IU] | Freq: Once | INTRAVENOUS | Status: DC

## 2018-10-19 NOTE — Patient Instructions (Signed)
Labor Induction Labor induction is when steps are taken to cause a pregnant woman to begin the labor process. Most women go into labor on their own between 37 weeks and 42 weeks of the pregnancy. When this does not happen or when there is a medical need, methods may be used to induce labor. Labor induction causes a pregnant woman's uterus to contract. It also causes the cervix to soften (ripen), open (dilate), and thin out (efface). Usually, labor is not induced before 39 weeks of the pregnancy unless there is a problem with the baby or mother. Before inducing labor, your health care provider will consider a number of factors, including the following:  The medical condition of you and the baby.  How many weeks along you are.  The status of the baby's lung maturity.  The condition of the cervix.  The position of the baby. What are the reasons for labor induction? Labor may be induced for the following reasons:  The health of the baby or mother is at risk.  The pregnancy is overdue by 1 week or more.  The water breaks but labor does not start on its own.  The mother has a health condition or serious illness, such as high blood pressure, infection, placental abruption, or diabetes.  The amniotic fluid amounts are low around the baby.  The baby is distressed. Convenience or wanting the baby to be born on a certain date is not a reason for inducing labor. What methods are used for labor induction? Several methods of labor induction may be used, such as:  Prostaglandin medicine. This medicine causes the cervix to dilate and ripen. The medicine will also start contractions. It can be taken by mouth or by inserting a suppository into the vagina.  Inserting a thin tube (catheter) with a balloon on the end into the vagina to dilate the cervix. Once inserted, the balloon is expanded with water, which causes the cervix to open.  Stripping the membranes. Your health care provider separates  amniotic sac tissue from the cervix, causing the cervix to be stretched and causing the release of a hormone called progesterone. This may cause the uterus to contract. It is often done during an office visit. You will be sent home to wait for the contractions to begin. You will then come in for an induction.  Breaking the water. Your health care provider makes a hole in the amniotic sac using a small instrument. Once the amniotic sac breaks, contractions should begin. This may still take hours to see an effect.  Medicine to trigger or strengthen contractions. This medicine is given through an IV access tube inserted into a vein in your arm. All of the methods of induction, besides stripping the membranes, will be done in the hospital. Induction is done in the hospital so that you and the baby can be carefully monitored. How long does it take for labor to be induced? Some inductions can take up to 2-3 days. Depending on the cervix, it usually takes less time. It takes longer when you are induced early in the pregnancy or if this is your first pregnancy. If a mother is still pregnant and the induction has been going on for 2-3 days, either the mother will be sent home or a cesarean delivery will be needed. What are the risks associated with labor induction? Some of the risks of induction include:  Changes in fetal heart rate, such as too high, too low, or erratic.  Fetal distress.    Chance of infection for the mother and baby.  Increased chance of having a cesarean delivery.  Breaking off (abruption) of the placenta from the uterus (rare).  Uterine rupture (very rare). When induction is needed for medical reasons, the benefits of induction may outweigh the risks. What are some reasons for not inducing labor? Labor induction should not be done if:  It is shown that your baby does not tolerate labor.  You have had previous surgeries on your uterus, such as a myomectomy or the removal of  fibroids.  Your placenta lies very low in the uterus and blocks the opening of the cervix (placenta previa).  Your baby is not in a head-down position.  The umbilical cord drops down into the birth canal in front of the baby. This could cut off the baby's blood and oxygen supply.  You have had a previous cesarean delivery.  There are unusual circumstances, such as the baby being extremely premature. This information is not intended to replace advice given to you by your health care provider. Make sure you discuss any questions you have with your health care provider. Document Released: 03/18/2007 Document Revised: 04/03/2016 Document Reviewed: 05/26/2013 Elsevier Interactive Patient Education  2017 Elsevier Inc.  

## 2018-10-19 NOTE — Progress Notes (Signed)
   PRENATAL VISIT NOTE  Subjective:  Colleen Sampson is a 22 y.o. G1P0000 at 9414w5d being seen today for ongoing prenatal care.  She is currently monitored for the following issues for this low-risk pregnancy and has Supervision of other normal pregnancy, antepartum and Language barrier on their problem list.  Patient reports no complaints. Has questions about her appts and the GBS status. .  Contractions: Irritability. Vag. Bleeding: None.  Movement: Present. Denies leaking of fluid.   The following portions of the patient's history were reviewed and updated as appropriate: allergies, current medications, past family history, past medical history, past social history, past surgical history and problem list. Problem list updated.  Objective:   Vitals:   10/19/18 0859  BP: 106/67  Pulse: (!) 101  Weight: 167 lb 9.6 oz (76 kg)    Fetal Status: Fetal Heart Rate (bpm): 160 Fundal Height: 35 cm Movement: Present     General:  Alert, oriented and cooperative. Patient is in no acute distress.  Skin: Skin is warm and dry. No rash noted.   Cardiovascular: Normal heart rate noted  Respiratory: Normal respiratory effort, no problems with respiration noted  Abdomen: Soft, gravid, appropriate for gestational age.  Pain/Pressure: Present     Pelvic: Cervical exam deferred        Extremities: Normal range of motion.  Edema: None  Mental Status: Normal mood and affect. Normal behavior. Normal judgment and thought content.   Assessment and Plan:  Pregnancy: G1P0000 at 9414w5d  1. Supervision of other normal pregnancy, antepartum -FH is still 35, but patient had growth US today and per patient the size was fine; no report yet available in Epic yet. Will watch for it today.  -Will have patient see provider sometime on Dec 16, 17, or 18, then 23 or 24 and schedule induction for December 26.  Will cancel appts on 13th and 20th.   Term labor symptoms and general obstetric precautions including but not  limited to vaginal bleeding, contractions, leaking of fluid and fetal movement were reviewed in detail with the patient. Please refer to After Visit Summary for other counseling recommendations.  Return in about 1 week (around 10/26/2018), or  .  Future Appointments  Date Time Provider Department Center  10/19/2018  9:55 AM Marylene LandKooistra, Paizleigh Wilds [place], CNM [facility]  10/22/2018 10:15 AM Judeth HornLawrence, Erin, NP [facility]  10/29/2018  8:35 AM Allie Bossierove, Myra C, MD [facility] [address]    Rykker Coviello [place], [place]

## 2018-10-22 ENCOUNTER — Encounter: Payer: Medicaid Other | Admitting: Student

## 2018-10-26 ENCOUNTER — Ambulatory Visit (INDEPENDENT_AMBULATORY_CARE_PROVIDER_SITE_OTHER): Payer: Medicaid Other | Admitting: Student

## 2018-10-26 DIAGNOSIS — O26843 Uterine size-date discrepancy, third trimester: Secondary | ICD-10-CM

## 2018-10-26 DIAGNOSIS — O26849 Uterine size-date discrepancy, unspecified trimester: Secondary | ICD-10-CM | POA: Insufficient documentation

## 2018-10-26 DIAGNOSIS — B951 Streptococcus, group B, as the cause of diseases classified elsewhere: Secondary | ICD-10-CM | POA: Insufficient documentation

## 2018-10-26 NOTE — Progress Notes (Signed)
   PRENATAL VISIT NOTE  Subjective:  Colleen Sampson is a 22 y.o. G1P0000 at 3372w5d being seen today for ongoing prenatal care.  She is currently monitored for the following issues for this low-risk pregnancy and has Supervision of other normal pregnancy, antepartum; Language barrier; and Positive GBS test on their problem list.  Patient reports no complaints.  Contractions: Irritability. Vag. Bleeding: None.  Movement: Present. Denies leaking of fluid.   The following portions of the patient's history were reviewed and updated as appropriate: allergies, current medications, past family history, past medical history, past social history, past surgical history and problem list. Problem list updated.  Objective:   Vitals:   10/26/18 0844  BP: 111/70  Pulse: 76  Weight: 169 lb (76.7 kg)    Fetal Status: Fetal Heart Rate (bpm): 128 Fundal Height: 35 cm Movement: Present     General:  Alert, oriented and cooperative. Patient is in no acute distress.  Skin: Skin is warm and dry. No rash noted.   Cardiovascular: Normal heart rate noted  Respiratory: Normal respiratory effort, no problems with respiration noted  Abdomen: Soft, gravid, appropriate for gestational age.  Pain/Pressure: Present     Pelvic: Cervical exam deferred        Extremities: Normal range of motion.     Mental Status: Normal mood and affect. Normal behavior. Normal judgment and thought content.   Assessment and Plan:  Pregnancy: G1P0000 at 6172w5d  1. Positive GBS test -Will plan for treatment in labor -Patient's FH continues to measure small; however, patient had US on 12-10 and fetus had growth in 79th percentile and normal fluid. -Patient is ok seeing female provider on 11-01-2018.   Preterm labor symptoms and general obstetric precautions including but not limited to vaginal bleeding, contractions, leaking of fluid and fetal movement were reviewed in detail with the patient. Please refer to After Visit Summary for other  counseling recommendations.  Return in about 6 days (around 11/01/2018) for NST/BPP and LOB.  Future Appointments  Date Time Provider Department Center  11/01/2018  8:15 AM [facility] NST [facility]  11/01/2018  9:15 AM [place] BingPickens, Charlie, MD [facility]  11/04/2018  7:00 AM [facility] ROOM [facility] None    Marylene LandKathryn [place] Coyle [place], [place]

## 2018-10-26 NOTE — Patient Instructions (Signed)
Labor Induction Labor induction is when steps are taken to cause a pregnant woman to begin the labor process. Most women go into labor on their own between 37 weeks and 42 weeks of the pregnancy. When this does not happen or when there is a medical need, methods may be used to induce labor. Labor induction causes a pregnant woman's uterus to contract. It also causes the cervix to soften (ripen), open (dilate), and thin out (efface). Usually, labor is not induced before 39 weeks of the pregnancy unless there is a problem with the baby or mother. Before inducing labor, your health care provider will consider a number of factors, including the following:  The medical condition of you and the baby.  How many weeks along you are.  The status of the baby's lung maturity.  The condition of the cervix.  The position of the baby. What are the reasons for labor induction? Labor may be induced for the following reasons:  The health of the baby or mother is at risk.  The pregnancy is overdue by 1 week or more.  The water breaks but labor does not start on its own.  The mother has a health condition or serious illness, such as high blood pressure, infection, placental abruption, or diabetes.  The amniotic fluid amounts are low around the baby.  The baby is distressed. Convenience or wanting the baby to be born on a certain date is not a reason for inducing labor. What methods are used for labor induction? Several methods of labor induction may be used, such as:  Prostaglandin medicine. This medicine causes the cervix to dilate and ripen. The medicine will also start contractions. It can be taken by mouth or by inserting a suppository into the vagina.  Inserting a thin tube (catheter) with a balloon on the end into the vagina to dilate the cervix. Once inserted, the balloon is expanded with water, which causes the cervix to open.  Stripping the membranes. Your health care provider separates  amniotic sac tissue from the cervix, causing the cervix to be stretched and causing the release of a hormone called progesterone. This may cause the uterus to contract. It is often done during an office visit. You will be sent home to wait for the contractions to begin. You will then come in for an induction.  Breaking the water. Your health care provider makes a hole in the amniotic sac using a small instrument. Once the amniotic sac breaks, contractions should begin. This may still take hours to see an effect.  Medicine to trigger or strengthen contractions. This medicine is given through an IV access tube inserted into a vein in your arm. All of the methods of induction, besides stripping the membranes, will be done in the hospital. Induction is done in the hospital so that you and the baby can be carefully monitored. How long does it take for labor to be induced? Some inductions can take up to 2-3 days. Depending on the cervix, it usually takes less time. It takes longer when you are induced early in the pregnancy or if this is your first pregnancy. If a mother is still pregnant and the induction has been going on for 2-3 days, either the mother will be sent home or a cesarean delivery will be needed. What are the risks associated with labor induction? Some of the risks of induction include:  Changes in fetal heart rate, such as too high, too low, or erratic.  Fetal distress.    Chance of infection for the mother and baby.  Increased chance of having a cesarean delivery.  Breaking off (abruption) of the placenta from the uterus (rare).  Uterine rupture (very rare). When induction is needed for medical reasons, the benefits of induction may outweigh the risks. What are some reasons for not inducing labor? Labor induction should not be done if:  It is shown that your baby does not tolerate labor.  You have had previous surgeries on your uterus, such as a myomectomy or the removal of  fibroids.  Your placenta lies very low in the uterus and blocks the opening of the cervix (placenta previa).  Your baby is not in a head-down position.  The umbilical cord drops down into the birth canal in front of the baby. This could cut off the baby's blood and oxygen supply.  You have had a previous cesarean delivery.  There are unusual circumstances, such as the baby being extremely premature. This information is not intended to replace advice given to you by your health care provider. Make sure you discuss any questions you have with your health care provider. Document Released: 03/18/2007 Document Revised: 04/03/2016 Document Reviewed: 05/26/2013 Elsevier Interactive Patient Education  2017 Elsevier Inc.  

## 2018-10-29 ENCOUNTER — Encounter: Payer: Medicaid Other | Admitting: Obstetrics & Gynecology

## 2018-11-01 ENCOUNTER — Ambulatory Visit (INDEPENDENT_AMBULATORY_CARE_PROVIDER_SITE_OTHER): Payer: Medicaid Other | Admitting: *Deleted

## 2018-11-01 ENCOUNTER — Inpatient Hospital Stay (HOSPITAL_COMMUNITY): Payer: Medicaid Other | Admitting: Anesthesiology

## 2018-11-01 ENCOUNTER — Ambulatory Visit: Payer: Self-pay

## 2018-11-01 ENCOUNTER — Encounter (HOSPITAL_COMMUNITY): Payer: Self-pay | Admitting: *Deleted

## 2018-11-01 ENCOUNTER — Ambulatory Visit (INDEPENDENT_AMBULATORY_CARE_PROVIDER_SITE_OTHER): Payer: Medicaid Other | Admitting: Obstetrics and Gynecology

## 2018-11-01 ENCOUNTER — Inpatient Hospital Stay (HOSPITAL_COMMUNITY)
Admission: AD | Admit: 2018-11-01 | Discharge: 2018-11-03 | DRG: 807 | Disposition: A | Discharge status: Home / Self Care | Payer: Medicaid Other | Attending: Obstetrics and Gynecology | Admitting: Obstetrics and Gynecology

## 2018-11-01 VITALS — BP 110/67 | HR 69 | Wt 172.0 lb

## 2018-11-01 DIAGNOSIS — Z3490 Encounter for supervision of normal pregnancy, unspecified, unspecified trimester: Secondary | ICD-10-CM

## 2018-11-01 DIAGNOSIS — O480 Post-term pregnancy: Secondary | ICD-10-CM | POA: Diagnosis present

## 2018-11-01 DIAGNOSIS — O4100X0 Oligohydramnios, unspecified trimester, not applicable or unspecified: Secondary | ICD-10-CM | POA: Diagnosis present

## 2018-11-01 DIAGNOSIS — O99824 Streptococcus B carrier state complicating childbirth: Secondary | ICD-10-CM | POA: Diagnosis present

## 2018-11-01 DIAGNOSIS — B951 Streptococcus, group B, as the cause of diseases classified elsewhere: Secondary | ICD-10-CM | POA: Diagnosis present

## 2018-11-01 DIAGNOSIS — O288 Other abnormal findings on antenatal screening of mother: Secondary | ICD-10-CM | POA: Insufficient documentation

## 2018-11-01 DIAGNOSIS — Z789 Other specified health status: Secondary | ICD-10-CM | POA: Diagnosis present

## 2018-11-01 DIAGNOSIS — Z3483 Encounter for supervision of other normal pregnancy, third trimester: Secondary | ICD-10-CM

## 2018-11-01 DIAGNOSIS — Z3A40 40 weeks gestation of pregnancy: Secondary | ICD-10-CM | POA: Diagnosis not present

## 2018-11-01 DIAGNOSIS — O4103X0 Oligohydramnios, third trimester, not applicable or unspecified: Principal | ICD-10-CM | POA: Diagnosis present

## 2018-11-01 DIAGNOSIS — Z3480 Encounter for supervision of other normal pregnancy, unspecified trimester: Secondary | ICD-10-CM

## 2018-11-01 LAB — TYPE AND SCREEN
ABO/RH(D): O POS
Antibody Screen: NEGATIVE

## 2018-11-01 LAB — CBC
HCT: 39.9 % (ref 36.0–46.0)
Hemoglobin: 13.1 g/dL (ref 12.0–15.0)
MCH: 27.8 pg (ref 26.0–34.0)
MCHC: 32.8 g/dL (ref 30.0–36.0)
MCV: 84.5 fL (ref 80.0–100.0)
Platelets: 203 10*3/uL (ref 150–400)
RBC: 4.72 MIL/uL (ref 3.87–5.11)
RDW: 14.6 % (ref 11.5–15.5)
WBC: 6.9 10*3/uL (ref 4.0–10.5)
nRBC: 0 % (ref 0.0–0.2)

## 2018-11-01 LAB — ABO/RH: ABO/RH(D): O POS

## 2018-11-01 MED ORDER — PENICILLIN G 3 MILLION UNITS IVPB - SIMPLE MED
3.0000 10*6.[IU] | INTRAVENOUS | Status: DC
  Administered 2018-11-01 – 2018-11-02 (×3): 3 10*6.[IU] via INTRAVENOUS
  Filled 2018-11-01 (×5): qty 100

## 2018-11-01 MED ORDER — MISOPROSTOL 25 MCG QUARTER TABLET
25.0000 ug | ORAL_TABLET | ORAL | Status: DC | PRN
  Administered 2018-11-01: 25 ug via VAGINAL
  Filled 2018-11-01: qty 1

## 2018-11-01 MED ORDER — SODIUM CHLORIDE 0.9 % IV SOLN
5.0000 10*6.[IU] | Freq: Once | INTRAVENOUS | Status: AC
  Administered 2018-11-01: 5 10*6.[IU] via INTRAVENOUS
  Filled 2018-11-01: qty 5

## 2018-11-01 MED ORDER — FENTANYL 2.5 MCG/ML BUPIVACAINE 1/10 % EPIDURAL INFUSION (WH - ANES)
14.0000 mL/h | INTRAMUSCULAR | Status: DC | PRN
  Administered 2018-11-01: 14 mL/h via EPIDURAL
  Filled 2018-11-01 (×2): qty 100

## 2018-11-01 MED ORDER — LIDOCAINE HCL (PF) 1 % IJ SOLN
30.0000 mL | INTRAMUSCULAR | Status: DC | PRN
  Filled 2018-11-01: qty 30

## 2018-11-01 MED ORDER — LACTATED RINGERS IV SOLN
INTRAVENOUS | Status: DC
  Administered 2018-11-01: 17:00:00 via INTRAVENOUS
  Administered 2018-11-01: 125 mL via INTRAVENOUS

## 2018-11-01 MED ORDER — OXYTOCIN 40 UNITS IN LACTATED RINGERS INFUSION - SIMPLE MED: 2.5000 [IU]/h | INTRAVENOUS | Status: DC

## 2018-11-01 MED ORDER — DIPHENHYDRAMINE HCL 50 MG/ML IJ SOLN: 12.5000 mg | INTRAMUSCULAR | Status: DC | PRN

## 2018-11-01 MED ORDER — MISOPROSTOL 50MCG HALF TABLET
50.0000 ug | ORAL_TABLET | ORAL | Status: DC | PRN
  Administered 2018-11-01: 50 ug via BUCCAL
  Filled 2018-11-01: qty 1

## 2018-11-01 MED ORDER — FENTANYL CITRATE (PF) 100 MCG/2ML IJ SOLN
100.0000 ug | INTRAMUSCULAR | Status: DC | PRN
  Administered 2018-11-01: 100 ug via INTRAVENOUS
  Filled 2018-11-01: qty 2

## 2018-11-01 MED ORDER — PHENYLEPHRINE 40 MCG/ML (10ML) SYRINGE FOR IV PUSH (FOR BLOOD PRESSURE SUPPORT)
80.0000 ug | PREFILLED_SYRINGE | INTRAVENOUS | Status: DC | PRN
  Filled 2018-11-01 (×2): qty 10

## 2018-11-01 MED ORDER — LACTATED RINGERS IV SOLN: 500.0000 mL | Freq: Once | INTRAVENOUS | Status: DC

## 2018-11-01 MED ORDER — EPHEDRINE 5 MG/ML INJ
10.0000 mg | INTRAVENOUS | Status: DC | PRN
  Filled 2018-11-01: qty 2

## 2018-11-01 MED ORDER — OXYCODONE-ACETAMINOPHEN 5-325 MG PO TABS: 1.0000 | ORAL_TABLET | ORAL | Status: DC | PRN

## 2018-11-01 MED ORDER — TERBUTALINE SULFATE 1 MG/ML IJ SOLN
0.2500 mg | Freq: Once | INTRAMUSCULAR | Status: DC | PRN
  Filled 2018-11-01: qty 1

## 2018-11-01 MED ORDER — ACETAMINOPHEN 325 MG PO TABS: 650.0000 mg | ORAL_TABLET | ORAL | Status: DC | PRN

## 2018-11-01 MED ORDER — OXYCODONE-ACETAMINOPHEN 5-325 MG PO TABS: 2.0000 | ORAL_TABLET | ORAL | Status: DC | PRN

## 2018-11-01 MED ORDER — LACTATED RINGERS IV SOLN: 500.0000 mL | INTRAVENOUS | Status: DC | PRN

## 2018-11-01 MED ORDER — OXYTOCIN BOLUS FROM INFUSION
500.0000 mL | Freq: Once | INTRAVENOUS | Status: AC
  Administered 2018-11-02: 500 mL via INTRAVENOUS

## 2018-11-01 MED ORDER — ONDANSETRON HCL 4 MG/2ML IJ SOLN: 4.0000 mg | Freq: Four times a day (QID) | INTRAMUSCULAR | Status: DC | PRN

## 2018-11-01 MED ORDER — PHENYLEPHRINE 40 MCG/ML (10ML) SYRINGE FOR IV PUSH (FOR BLOOD PRESSURE SUPPORT)
80.0000 ug | PREFILLED_SYRINGE | INTRAVENOUS | Status: DC | PRN
  Filled 2018-11-01: qty 10

## 2018-11-01 MED ORDER — FENTANYL CITRATE (PF) 100 MCG/2ML IJ SOLN
INTRAMUSCULAR | Status: AC
  Administered 2018-11-01: 100 ug
  Filled 2018-11-01: qty 2

## 2018-11-01 MED ORDER — OXYTOCIN 40 UNITS IN LACTATED RINGERS INFUSION - SIMPLE MED
1.0000 m[IU]/min | INTRAVENOUS | Status: DC
  Filled 2018-11-01: qty 1000

## 2018-11-01 MED ORDER — SOD CITRATE-CITRIC ACID 500-334 MG/5ML PO SOLN: 30.0000 mL | ORAL | Status: DC | PRN

## 2018-11-01 MED ORDER — LIDOCAINE HCL (PF) 1 % IJ SOLN
INTRAMUSCULAR | Status: DC | PRN
  Administered 2018-11-01 (×2): 5 mL via EPIDURAL

## 2018-11-01 NOTE — Anesthesia Preprocedure Evaluation (Signed)

## 2018-11-01 NOTE — Anesthesia Procedure Notes (Signed)
Epidural Patient location during procedure: OB  Staffing Anesthesiologist: Kenzi Bardwell, MD Performed: anesthesiologist   Preanesthetic Checklist Completed: patient identified, site marked, surgical consent, pre-op evaluation, timeout performed, IV checked, risks and benefits discussed and monitors and equipment checked  Epidural Patient position: sitting Prep: DuraPrep Patient monitoring: heart rate, continuous pulse ox and blood pressure Approach: right paramedian Location: L3-L4 Injection technique: LOR saline  Needle:  Needle type: Tuohy  Needle gauge: 17 G Needle length: 9 cm and 9 Needle insertion depth: 5 cm Catheter type: closed end flexible Catheter size: 20 Guage Catheter at skin depth: 10 cm Test dose: negative  Assessment Events: blood not aspirated, injection not painful, no injection resistance, negative IV test and no paresthesia  Additional Notes Patient identified. Risks/Benefits/Options discussed with patient including but not limited to bleeding, infection, nerve damage, paralysis, failed block, incomplete pain control, headache, blood pressure changes, nausea, vomiting, reactions to medication both or allergic, itching and postpartum back pain. Confirmed with bedside nurse the patient's most recent platelet count. Confirmed with patient that they are not currently taking any anticoagulation, have any bleeding history or any family history of bleeding disorders. Patient expressed understanding and wished to proceed. All questions were answered. Sterile technique was used throughout the entire procedure. Please see nursing notes for vital signs. Test dose was given through epidural needle and negative prior to continuing to dose epidural or start infusion. Warning signs of high block given to the patient including shortness of breath, tingling/numbness in hands, complete motor block, or any concerning symptoms with instructions to call for help. Patient was given  instructions on fall risk and not to get out of bed. All questions and concerns addressed with instructions to call with any issues.     

## 2018-11-01 NOTE — Progress Notes (Signed)
LABOR PROGRESS NOTE  Colleen Sampson is a 22 y.o. G1P0000 at 6924w4d  admitted for IOL for oligohydramnios, NRNST.   Subjective: Comfortable with epidural. Husband at bedside, acting as interpreter.   Objective: BP 107/72   Pulse 79   Temp 97.8 F (36.6 C) (Oral)   Resp 16   Ht 5\' 5"  (1.651 m)   Wt 78 kg   LMP 01/21/2018 (Exact Date)   SpO2 100%   BMI 28.62 kg/m  or  Vitals:   11/01/18 1835 11/01/18 1836 11/01/18 1841 11/01/18 1847  BP:  119/70  107/72  Pulse:  71 86 79  Resp:    16  Temp:      TempSrc:      SpO2: 100%     Weight:      Height:        Dilation: 6.5 Effacement (%): 70 Cervical Position: Middle Station: -2 Presentation: Vertex Exam by:: Gearldine Bienenstockiana Castillo, RN FHT: baseline rate 130, moderate varibility, +acel, no decel Toco: q2-3 min   Labs: Lab Results  Component Value Date   WBC 6.9 11/01/2018   HGB 13.1 11/01/2018   HCT 39.9 11/01/2018   MCV 84.5 11/01/2018   PLT 203 11/01/2018    Patient Active Problem List   Diagnosis Date Noted  . Oligohydramnios antepartum 11/01/2018  . Non-stress test nonreactive 11/01/2018  . Indication for care in labor and delivery, antepartum 11/01/2018  . Positive GBS test 10/26/2018  . Uterine size date discrepancy 10/26/2018  . Language barrier 06/09/2018  . Supervision of other normal pregnancy, antepartum 05/12/2018    Assessment / Plan: 22 y.o. G1P0000 at 6824w4d here for IOL for oligo, NRSNT.   Labor: Progressing well without further augmentation, good contraction pattern. Will plan to recheck in 2 hours. Start pitocin if cervix unchanged, otherwise expectant management.  Fetal Wellbeing:  Cat I  Pain Control:  Epidural in place  Anticipated MOD:  NSVD  Marcy Sirenatherine Wallace, D.O. OB Fellow  11/01/2018, 9:39 PM

## 2018-11-01 NOTE — Progress Notes (Signed)
Prenatal Visit Note Date: 11/01/2018 Clinic: [facility]  Subjective:  Colleen Sampson is a 22 y.o. G1P0000 at 4459w4d being seen today for ongoing prenatal care.  She is currently monitored for the following issues for this low-risk pregnancy and has Supervision of other normal pregnancy, antepartum; Language barrier; Positive GBS test; Uterine size date discrepancy; Oligohydramnios antepartum; and Non-stress test nonreactive on their problem list.  Patient reports feeling the contractions. mildly uncomfortable.   Contractions: Irregular. Vag. Bleeding: None.  Movement: Present. Denies leaking of fluid.   The following portions of the patient's history were reviewed and updated as appropriate: allergies, current medications, past family history, past medical history, past social history, past surgical history and problem list. Problem list updated.  Objective:   Vitals:   11/01/18 0831  BP: 110/67  Pulse: 69  Weight: 172 lb (78 kg)    Fetal Status: Fetal Heart Rate (bpm): NST   Movement: Present  Presentation: Vertex  General:  Alert, oriented and cooperative. Patient is in no acute distress.  Skin: Skin is warm and dry. No rash noted.   Cardiovascular: Normal heart rate noted  Respiratory: Normal respiratory effort, no problems with respiration noted  Abdomen: Soft, gravid, appropriate for gestational age. Pain/Pressure: Present     Pelvic:  Cervical exam deferred        Extremities: Normal range of motion.  Edema: None  Mental Status: Normal mood and affect. Normal behavior. Normal judgment and thought content.   Urinalysis:      Assessment and Plan:  Pregnancy: G1P0000 at 3559w4d  1. Supervision of other normal pregnancy, antepartum Routine care. Given AFI 2 and category I but non reactive NST (135 baseline, no accels, no decel, mod variability, toco q2-2149m), recommend IOL, which she is amenable to. Fetus cephalic. D/w Dr. Earlene Plateravis  Interpreter used  Term  labor symptoms and general obstetric precautions including but not limited to vaginal bleeding, contractions, leaking of fluid and fetal movement were reviewed in detail with the patient. Please refer to After Visit Summary for other counseling recommendations.  Return for IOL on 12/26.   [place] BingPickens, Jaida Basurto, MD

## 2018-11-01 NOTE — Progress Notes (Signed)
OB/[facility]N Faculty [facility]: Labor Progress Note  Subjective: Strip note. Received call from RN that patient has received epidural, FB out, and now 5cm.   Objective: BP 107/72   Pulse 79   Temp 97.8 F (36.6 C) (Oral)   Resp 16   Ht 5\' 5"  (1.651 m)   Wt 78 kg   LMP 01/21/2018 (Exact Date)   SpO2 100%   BMI 28.62 kg/m  Gen: well-appearing, NAD Dilation: 5 Effacement (%): 60 Cervical Position: Posterior Station: -2 Presentation: Vertex Exam by:: lee  Assessment and Plan: Colleen Sampson is a 22 y.o. G1P0000 at 5941w4d here for IOL for oligohydramnios, non-reactive NST.  Labor: Transitioning into active labor. FB now out, received 2 doses of cytotec and now contracting well on her own.  -- pitocin ordered, plan to recheck in about an hour and start if no change noted -- pain control: epidural in place -- PPH risk: low   Fetal Wellbeing: EFW 79% at 38w5. EFW 7-8lbs by Leopold's. Cephalic by prior check.  -- GBS (positive) - pcn -- continuous fetal monitoring - category I - having some early decelerations   Ibtisam Benge S. Earlene PlaterWallace, DO OB/GYN Fellow, Faculty Practice  7:01 PM

## 2018-11-01 NOTE — H&P (Signed)
OBSTETRIC ADMISSION HISTORY AND PHYSICAL  Colleen Sampson is a 22 y.o. female G1P0000 with IUP at 5144w4d by L/19 presenting for induction of labor for oligohydramnios and non-reactive NST. NST done for being 40w4.   Reports fetal movement. Denies vaginal bleeding, leakage of fluids.  [facility]  Support person in labor: husband  Ultrasounds . 19w0: normal anatomy U/S, posterior placenta, normal cord insertion, some limited views . 23w4: completion of anatomy scan wnl . 38w5: EFW 3504g (79%), obtained for size/date discrepancy  Prenatal History/Complications: . Oligohydramnios: diagnosed at 40w4 in clinic with AFI 2 . Arabic speaking . Size<dates but actually measuring 79% on ultrasound  Past Medical History: Past Medical History:  Diagnosis Date  . Medical history non-contributory     Past Surgical History: Past Surgical History:  Procedure Laterality Date  . NO PAST SURGERIES      Obstetrical History: OB History    Gravida  1   Para  0   Term  0   Preterm  0   AB  0   Living  0     SAB  0   TAB  0   Ectopic  0   Multiple  0   Live Births  0           Social History: Social History   Socioeconomic History  . Marital status: Married    Spouse name: Not on file  . Number of children: Not on file  . Years of education: Not on file  . Highest education level: Not on file  Occupational History  . Not on file  Social Needs  . Financial resource strain: Not on file  . Food insecurity:    Worry: Not on file    Inability: Not on file  . Transportation needs:    Medical: Not on file    Non-medical: Not on file  Tobacco Use  . Smoking status: Never Smoker  . Smokeless tobacco: Never Used  Substance and Sexual Activity  . Alcohol use: No    Frequency: Never  . Drug use: No  . Sexual activity: Yes    Birth control/protection: None  Lifestyle  . Physical activity:    Days per week: Not on file    Minutes per  session: Not on file  . Stress: Not on file  Relationships  . Social connections:    Talks on phone: Not on file    Gets together: Not on file    Attends religious service: Not on file    Active member of club or organization: Not on file    Attends meetings of clubs or organizations: Not on file    Relationship status: Not on file  Other Topics Concern  . Not on file  Social History Narrative  . Not on file    Family History: Family History  Problem Relation Age of Onset  . Hypertension Mother   . Hypertension Father     Allergies: No Known Allergies  Medications Prior to Admission  Medication Sig Dispense Refill Last Dose  . Prenatal Vit-Fe Fumarate-FA (PRENATAL VITAMIN) 27-0.8 MG TABS Take 1 tablet by mouth daily. 30 tablet 1 Taking     Review of Systems  All systems reviewed and negative except as stated in HPI  Blood pressure 121/67, pulse 67, temperature 98.5 F (36.9 C), temperature source Oral, resp. rate 16, height 5\' 5"  (1.651 m), weight 78 kg, last menstrual period 01/21/2018.  General appearance: alert, well-appearing,  NAD Lungs: no respiratory distress Heart: regular rate  Abdomen: soft, non-tender; gravid Pelvic: deferred Extremities: no lower extremity edema Presentation: cephalic by prior check Fetal monitoring: 120s/mod/+a/-d Uterine activity: irregular  Dilation: Closed Effacement (%): Thick Station: -2 Exam by:: lee  Prenatal labs: ABO, Rh: --/--/O POS, O POS Performed at [facility], [address] [place], [place] [address]  [phone](12/23 1203) Antibody: NEG (12/23 1203) Rubella: 19.50 (07/03 1437) RPR: Non Reactive (09/25 1047)  HBsAg: Negative (07/03 1437)  HIV: Non Reactive (09/25 1047)  GBS: Positive (11/25 0000)  Glucola: normal 2-hr Genetic screening:  Low risk NIPS, negative AFP screen   Prenatal Transfer Tool  Maternal Diabetes: No Genetic Screening: Normal Maternal Ultrasounds/Referrals: Normal Fetal Ultrasounds or other  Referrals:  None Maternal Substance Abuse:  No Significant Maternal Medications:  None Significant Maternal Lab Results: None  Results for orders placed or performed during the hospital encounter of 11/01/18 (from the past 24 hour(s))  CBC   Collection Time: 11/01/18 10:25 AM  Result Value Ref Range   WBC 6.9 4.0 - 10.5 K/uL   RBC 4.72 3.87 - 5.11 MIL/uL   Hemoglobin 13.1 12.0 - 15.0 g/dL   HCT 60.439.9 54.036.0 - 98.146.0 %   MCV 84.5 80.0 - 100.0 fL   MCH 27.8 26.0 - 34.0 pg   MCHC 32.8 30.0 - 36.0 g/dL   RDW 19.114.6 47.811.5 - 29.515.5 %   Platelets 203 150 - 400 K/uL   nRBC 0.0 0.0 - 0.2 %  Type and screen   Collection Time: 11/01/18 12:03 PM  Result Value Ref Range   ABO/RH(D) O POS    Antibody Screen NEG    Sample Expiration      11/04/2018 Performed at [facility], [address] [place], [place] [address]   ABO/Rh   Collection Time: 11/01/18 12:03 PM  Result Value Ref Range   ABO/RH(D)      O POS Performed at [facility], [address] [place], [place] [address]     Patient Active Problem List   Diagnosis Date Noted  . Oligohydramnios antepartum 11/01/2018  . Non-stress test nonreactive 11/01/2018  . Indication for care in labor and delivery, antepartum 11/01/2018  . Positive GBS test 10/26/2018  . Uterine size date discrepancy 10/26/2018  . Language barrier 06/09/2018  . Supervision of other normal pregnancy, antepartum 05/12/2018    Assessment/Plan:  Colleen Sampson is a 22 y.o. G1P0000 at 4176w4d here for IOL for oligohydramnios, non-reactive NST.  Labor: Induction will start with cytotec and hopefully FB placement. -- pain control: plans for epidural -- PPH risk: low   Fetal Wellbeing: EFW 79% at 38w5. EFW 7-8lbs by Leopold's. Cephalic by prior check.  -- GBS (positive) - pcn -- continuous fetal monitoring - category I   Postpartum Planning -- breast/undecided -- RI/[x] Tdap/[x] flu -- O+ blood type   Thoren Hosang S. Earlene PlaterWallace, DO OB/GYN Fellow

## 2018-11-01 NOTE — Anesthesia Pain Management Evaluation Note (Signed)
  CRNA Pain Management Visit Note  Patient: Colleen Sampson, 22 y.o., female  "Hello I am a member of the anesthesia team at [facility]. We have an anesthesia team available at all times to provide care throughout the hospital, including epidural management and anesthesia for C-section. I don't know your plan for the delivery whether it a natural birth, water birth, IV sedation, nitrous supplementation, doula or epidural, but we want to meet your pain goals."   1.Was your pain managed to your expectations on prior hospitalizations?   No prior hospitalizations  2.What is your expectation for pain management during this hospitalization?     Epidural  3.How can we help you reach that goal? Epidural  Record the patient's initial score and the patient's pain goal.   Pain: 4  Pain Goal: 7 [facility] wants you to be able to say your pain was always managed very well.  Cleda ClarksBrowder, Colleen Sampson 11/01/2018

## 2018-11-01 NOTE — Progress Notes (Signed)
Video interpreter Bonita QuinLinda # [id number] used for encounter. Pt reports increased frequency and intensity of UC's. Pt for direct admit today for IOL due to BPP 6/10 and oligohydramnios.

## 2018-11-01 NOTE — Progress Notes (Signed)

## 2018-11-01 NOTE — Progress Notes (Signed)
OB/GYN Faculty Practice: Labor Progress Note  Subjective: Doing well, comfortable. Brother, husband and mother-in-law in room.   Objective: BP 108/70   Pulse 81   Temp 98.5 F (36.9 C) (Oral)   Resp 16   Ht 5\' 5"  (1.651 m)   Wt 78 kg   LMP 01/21/2018 (Exact Date)   BMI 28.62 kg/m  Gen: well-appearing, NAD Dilation: 1.5 Effacement (%): Thick Cervical Position: Posterior Station: -2 Presentation: Vertex Exam by:: l.Chelle Cayton  Assessment and Plan: Colleen Sampson is a 22 y.o. G1P0000 at 4917w4d here for IOL for oligohydramnios, non-reactive NST.  Labor: Cervix now 1-2/50/-3 on exam after 1 dose of vaginal cytotec. Discussed FB placement with patient who is agreeable. FB placed without difficulty. Will give additional dose of cytotec. Plan to reassess in about 4 hours to determine continuing cytotec or switching to pitocin.  -- pain control: plans for epidural -- PPH risk: low   Fetal Wellbeing: EFW 79% at 38w5. EFW 7-8lbs by Leopold's. Cephalic by prior check.  -- GBS (positive) - pcn -- continuous fetal monitoring - category I   Shivangi Lutz S. Earlene PlaterWallace, DO OB/GYN Fellow, Faculty Practice  2:37 PM

## 2018-11-02 ENCOUNTER — Encounter: Payer: Self-pay | Admitting: Obstetrics and Gynecology

## 2018-11-02 ENCOUNTER — Encounter (HOSPITAL_COMMUNITY): Payer: Self-pay

## 2018-11-02 DIAGNOSIS — Z3A40 40 weeks gestation of pregnancy: Secondary | ICD-10-CM

## 2018-11-02 DIAGNOSIS — Z8759 Personal history of other complications of pregnancy, childbirth and the puerperium: Secondary | ICD-10-CM | POA: Insufficient documentation

## 2018-11-02 DIAGNOSIS — O4103X0 Oligohydramnios, third trimester, not applicable or unspecified: Secondary | ICD-10-CM

## 2018-11-02 DIAGNOSIS — O480 Post-term pregnancy: Secondary | ICD-10-CM

## 2018-11-02 LAB — RPR: RPR Ser Ql: NONREACTIVE

## 2018-11-02 MED ORDER — DIBUCAINE 1 % RE OINT: 1.0000 "application " | TOPICAL_OINTMENT | RECTAL | Status: DC | PRN

## 2018-11-02 MED ORDER — PRENATAL MULTIVITAMIN CH
1.0000 | ORAL_TABLET | Freq: Every day | ORAL | Status: DC
  Administered 2018-11-02: 1 via ORAL
  Filled 2018-11-02: qty 1

## 2018-11-02 MED ORDER — ONDANSETRON HCL 4 MG/2ML IJ SOLN: 4.0000 mg | INTRAMUSCULAR | Status: DC | PRN

## 2018-11-02 MED ORDER — COCONUT OIL OIL: 1.0000 "application " | TOPICAL_OIL | Status: DC | PRN

## 2018-11-02 MED ORDER — ONDANSETRON HCL 4 MG PO TABS: 4.0000 mg | ORAL_TABLET | ORAL | Status: DC | PRN

## 2018-11-02 MED ORDER — ZOLPIDEM TARTRATE 5 MG PO TABS: 5.0000 mg | ORAL_TABLET | Freq: Every evening | ORAL | Status: DC | PRN

## 2018-11-02 MED ORDER — SIMETHICONE 80 MG PO CHEW: 80.0000 mg | CHEWABLE_TABLET | ORAL | Status: DC | PRN

## 2018-11-02 MED ORDER — OXYCODONE HCL 5 MG PO TABS: 5.0000 mg | ORAL_TABLET | ORAL | Status: DC | PRN

## 2018-11-02 MED ORDER — ACETAMINOPHEN 325 MG PO TABS
650.0000 mg | ORAL_TABLET | ORAL | Status: DC | PRN
  Administered 2018-11-02 – 2018-11-03 (×4): 650 mg via ORAL
  Filled 2018-11-02 (×4): qty 2

## 2018-11-02 MED ORDER — SENNOSIDES-DOCUSATE SODIUM 8.6-50 MG PO TABS
2.0000 | ORAL_TABLET | ORAL | Status: DC
  Administered 2018-11-03: 2 via ORAL
  Filled 2018-11-02: qty 2

## 2018-11-02 MED ORDER — MAGNESIUM HYDROXIDE 400 MG/5ML PO SUSP: 30.0000 mL | ORAL | Status: DC | PRN

## 2018-11-02 MED ORDER — DIPHENHYDRAMINE HCL 25 MG PO CAPS: 25.0000 mg | ORAL_CAPSULE | Freq: Four times a day (QID) | ORAL | Status: DC | PRN

## 2018-11-02 MED ORDER — WITCH HAZEL-GLYCERIN EX PADS: 1.0000 "application " | MEDICATED_PAD | CUTANEOUS | Status: DC | PRN

## 2018-11-02 MED ORDER — OXYCODONE HCL 5 MG PO TABS: 10.0000 mg | ORAL_TABLET | ORAL | Status: DC | PRN

## 2018-11-02 MED ORDER — IBUPROFEN 600 MG PO TABS
600.0000 mg | ORAL_TABLET | Freq: Four times a day (QID) | ORAL | Status: DC
  Administered 2018-11-02 – 2018-11-03 (×6): 600 mg via ORAL
  Filled 2018-11-02 (×6): qty 1

## 2018-11-02 MED ORDER — BENZOCAINE-MENTHOL 20-0.5 % EX AERO
1.0000 "application " | INHALATION_SPRAY | CUTANEOUS | Status: DC | PRN
  Administered 2018-11-02 – 2018-11-03 (×2): 1 via TOPICAL
  Filled 2018-11-02 (×2): qty 56

## 2018-11-02 MED ORDER — TETANUS-DIPHTH-ACELL PERTUSSIS 5-2.5-18.5 LF-MCG/0.5 IM SUSP: 0.5000 mL | Freq: Once | INTRAMUSCULAR | Status: DC

## 2018-11-02 NOTE — Anesthesia Postprocedure Evaluation (Signed)
Anesthesia Post Note  Patient: Colleen Sampson, Colleen Sampson  Procedure(s) Performed: AN AD HOC LABOR EPIDURAL     Patient location during evaluation: Mother Baby Anesthesia Type: Epidural Level of consciousness: awake and alert Pain management: pain level controlled Vital Signs Assessment: post-procedure vital signs reviewed and stable Respiratory status: spontaneous breathing, nonlabored ventilation and respiratory function stable Cardiovascular status: stable Postop Assessment: no headache, no backache and epidural receding Anesthetic complications: no    Last Vitals:  Vitals:   11/02/18 0540 11/02/18 0644  BP: 102/73 104/70  Pulse: 77 83  Resp: 16 16  Temp: 36.9 C 36.7 C  SpO2:      Last Pain:  Vitals:   11/02/18 0644  TempSrc: Oral   Pain Goal:                 Anh Mangano

## 2018-11-02 NOTE — Progress Notes (Signed)
Explained newborn screen, bili test, and heart screen to mother and dad (interpeter) and sister (who speaks fluent english and will be staying with her tonight); no questions or concerns.

## 2018-11-02 NOTE — Progress Notes (Signed)
Due to census, acuity, and limited staffing, CSW is unable to meet with MOB to complete assessment for [place] 10. CSW spoke with bedside nurse and PMAD education will be provided at discharge.  Bedside nurse did not have any concerns or noted any additional psychosocial stressors.    Please consult CSW if concerns arise or by MOB's request.   There are no barriers to discharge.   Celso SickleKimberly Mareesa Gathright, LCSWA Clinical Social Worker [facility] Cell#: [phone]

## 2018-11-02 NOTE — Lactation Note (Signed)
This note was copied from a baby's chart. Lactation Consultation Note  Patient Name: Colleen Sampson QMVHQ'IToday's Date: 11/02/2018 Reason for consult: Initial assessment   P1, 11 hours old.  Video Interpreter for Arabic used # [id number]. Reviewed hand expression with good flow of colostrum.  Nipples evert and compressible. Baby sleeping.  Suggest unwrapping him for next feeding. During consult visitors arrived.  Suggest calling if latch assistance is needed. Mother denies questions or concerns. Feed on demand approximately 8-12 times per day.   Mom made aware of O/P services, breastfeeding support groups, community resources, and our phone # for post-discharge questions.     Maternal Data Has patient been taught Hand Expression?: Yes Does the patient have breastfeeding experience prior to this delivery?: No  Feeding Feeding Type: Breast Fed  LATCH Score Latch: Grasps breast easily, tongue down, lips flanged, rhythmical sucking.  Audible Swallowing: Spontaneous and intermittent  Type of Nipple: Everted at rest and after stimulation  Comfort (Breast/Nipple): Soft / non-tender  Hold (Positioning): No assistance needed to correctly position infant at breast.  LATCH Score: 10  Interventions Interventions: Breast feeding basics reviewed;Hand express  Lactation Tools Discussed/Used     Consult Status Consult Status: Follow-up Date: 11/03/18 Follow-up type: In-patient    Dahlia ByesBerkelhammer, Lukah Goswami [facility] 11/02/2018, 2:02 PM

## 2018-11-02 NOTE — Progress Notes (Signed)
LABOR PROGRESS NOTE  Ixel Roseanne RenoHassan is a 22 y.o. G1P0000 at 3285w4d  admitted for IOL for oligohydramnios, NRNST.   Subjective: Pushing with RN. Checked in and making good progress.   Objective: BP (!) 117/98   Pulse 99   Temp 100.2 F (37.9 C) (Axillary)   Resp 16   Ht 5\' 5"  (1.651 m)   Wt 78 kg   LMP 01/21/2018 (Exact Date)   SpO2 100%   BMI 28.62 kg/m  or  Vitals:   11/02/18 0123 11/02/18 0130 11/02/18 0200 11/02/18 0230  BP:  111/70 110/71 (!) 117/98  Pulse:  95 99   Resp:      Temp: 100.2 F (37.9 C)     TempSrc: Axillary     SpO2:      Weight:      Height:        Dilation: 10 Dilation Complete Date: 11/02/18 Dilation Complete Time: 0006 Effacement (%): 100 Cervical Position: Middle Station: 0 Presentation: Vertex Exam by:: Auriel RN  FHT: baseline rate 170, moderate varibility, +acel, variable decel Toco: q2-3 min   Labs: Lab Results  Component Value Date   WBC 6.9 11/01/2018   HGB 13.1 11/01/2018   HCT 39.9 11/01/2018   MCV 84.5 11/01/2018   PLT 203 11/01/2018    Patient Active Problem List   Diagnosis Date Noted  . Oligohydramnios antepartum 11/01/2018  . Non-stress test nonreactive 11/01/2018  . Indication for care in labor and delivery, antepartum 11/01/2018  . Positive GBS test 10/26/2018  . Uterine size date discrepancy 10/26/2018  . Language barrier 06/09/2018  . Supervision of other normal pregnancy, antepartum 05/12/2018    Assessment / Plan: 22 y.o. G1P0000 at 6585w4d here for IOL for oligo, NRSNT.   Labor:Currently pushing. Expectant management.  Fetal Wellbeing:  Cat II  Pain Control:  Epidural in place  Anticipated MOD:  NSVD  Marcy Sirenatherine , D.O. OB Fellow  11/02/2018, 2:38 AM

## 2018-11-03 ENCOUNTER — Ambulatory Visit: Payer: Self-pay

## 2018-11-03 LAB — CBC
HEMATOCRIT: 34 % — AB (ref 36.0–46.0)
Hemoglobin: 11.1 g/dL — ABNORMAL LOW (ref 12.0–15.0)
MCH: 27.6 pg (ref 26.0–34.0)
MCHC: 32.6 g/dL (ref 30.0–36.0)
MCV: 84.6 fL (ref 80.0–100.0)
Platelets: 210 10*3/uL (ref 150–400)
RBC: 4.02 MIL/uL (ref 3.87–5.11)
RDW: 15.2 % (ref 11.5–15.5)
WBC: 14.4 10*3/uL — AB (ref 4.0–10.5)
nRBC: 0 % (ref 0.0–0.2)

## 2018-11-03 MED ORDER — IBUPROFEN 600 MG PO TABS: 600.0000 mg | ORAL_TABLET | Freq: Four times a day (QID) | ORAL | 0 refills | Status: DC

## 2018-11-03 NOTE — Lactation Note (Signed)
This note was copied from a baby's chart. Lactation Consultation Note  Patient Name: Colleen Orville Governmira Kondracki ZOXWR'UToday's Date: 11/03/2018 Reason for consult: Follow-up assessment;Primapara;1st time breastfeeding;Infant weight loss  FOB [facility] an [facility], he's bilingual in Arabic and [place].  5639 hours old FT female who is being exclusively BF by her mother, she's a P1. Mom may decide to supplement with formula at some point though, that was her feeding choice upon admission. Advised mom to try to put baby tot he breast as much as she can until BF is well established around 3-4 weeks post-partum. Baby is at 7% weight loss, mom and baby are going home today.  Mom requested latch assistance before she leaves, she wanted to make sure BF is going well, praised her for her efforts. LC checked baby's diaper before latching him to the breast and noticed that he had a small stool, dad changed his diaper. Mom hand expressed some colostrum prior latching baby tot he breast. LC took baby STS to mom's right breast and he was able to latch easily, mom just needed some help with positioning baby. Baby fed for at least 6 minutes with a few audible swallows upon breast compressions, baby still feeding when exiting the room.  Parents were very engaged during lactation consultation and had lots of questions. LC showed parents how to burp baby, and how to hold them when they're spitty, baby had a large spit up, and LC also assisted them changing the cover for baby's bassinet. Discussed feeding cues and cluster feeding.   Feeding plan:  1. Encouraged mom to feed baby STS 8-12 times/24 hours or sooner if feeding cues are present 2. Hand expression and spoon feeding was also encouraged 3. Parents aware that feeding plan may need to be reassessed when baby has his next Dr. Alfonzo BeersAppt in two days. 4. Dad will be getting mom a breastpump   Reviewed discharge instructions, engorgement prevention and  treatment, prevention and treatment of sore nipples and red flags on when to call your baby's pediatrician. Parents reported all questions and concerns were answered, they're both aware of LC OP services and will contact PRN.  Maternal Data    Feeding Feeding Type: Breast Fed  LATCH Score Latch: Grasps breast easily, tongue down, lips flanged, rhythmical sucking.  Audible Swallowing: A few with stimulation(with breast compressions)  Type of Nipple: Everted at rest and after stimulation  Comfort (Breast/Nipple): Soft / non-tender  Hold (Positioning): Assistance needed to correctly position infant at breast and maintain latch.  LATCH Score: 8  Interventions Interventions: Breast feeding basics reviewed;Assisted with latch;Skin to skin;Breast massage;Breast compression;Hand express;Adjust position;Support pillows  Lactation Tools Discussed/Used     Consult Status Consult Status: Complete Date: 11/03/18 Follow-up type: Call as needed    Fable Huisman Venetia ConstableS Anona Giovannini 11/03/2018, 6:52 PM

## 2018-11-04 ENCOUNTER — Inpatient Hospital Stay (HOSPITAL_COMMUNITY)
Admission: RE | Admit: 2018-11-04 | Discharge: 2018-11-04 | Disposition: A | Discharge status: Home / Self Care | Payer: Medicaid Other | Source: Ambulatory Visit | Attending: Obstetrics & Gynecology | Admitting: Obstetrics & Gynecology

## 2018-11-04 MED FILL — IBUPROFEN 600 MG TABLET: 600 | 8 days supply | Qty: 30 | Fill #0

## 2018-11-04 NOTE — Discharge Summary (Signed)
Postpartum Discharge Summary     Patient Name: Colleen Sampson DOB: 04/08/1996 MRN: [id number]  Date of admission: 11/01/2018 Delivering Provider: Arvilla MarketWALLACE, CATHERINE LAUREN   Date of discharge: 11/04/2018  Admitting diagnosis: 40wks induction Intrauterine pregnancy: 4326w5d     Secondary diagnosis:  Principal Problem:   Oligohydramnios antepartum Active Problems:   Language barrier   Positive GBS test   Indication for care in labor and delivery, antepartum  Additional problems: none     Discharge diagnosis: Term Pregnancy Delivered                                                                                                Post partum procedures:none  Augmentation: Cytotec and Foley Balloon  Complications: None  Hospital course:  Induction of Labor With Vaginal Delivery   22 y.o. yo G1P1001 at 4426w5d was admitted to the hospital 11/01/2018 for induction of labor.  Indication for induction: Oligohydramnios.  Patient had an uncomplicated labor course as follows: Membrane Rupture Time/Date: 6:00 PM ,11/01/2018   Intrapartum Procedures: Episiotomy: None [1]                                         Lacerations:  2nd degree [3]  Patient had delivery of a Viable infant.  Information for the patient's newborn:  Colleen Sampson, Colleen Sampson [[id number]][[id number]]  Delivery Method: Vag-Spont   11/02/2018  Details of delivery can be found in separate delivery note.  Patient had a routine postpartum course. Patient is discharged home 11/04/18.  Magnesium Sulfate recieved: No BMZ received: No  Physical exam  Vitals:   11/02/18 2210 11/03/18 0532 11/03/18 0546 11/03/18 1357  BP: 111/71 107/72  116/68  Pulse: 85 84  91  Resp: 20 20  17   Temp: 98.6 F (37 C)  97.8 F (36.6 C) 98.1 F (36.7 C)  TempSrc: Oral  Oral Oral  SpO2: 97% 98%  98%  Weight:      Height:       General: alert, cooperative and no distress Lochia: appropriate Uterine Fundus: firm Incision: N/A DVT Evaluation: No evidence of  DVT seen on physical exam. Labs: Lab Results  Component Value Date   WBC 14.4 (H) 11/03/2018   HGB 11.1 (L) 11/03/2018   HCT 34.0 (L) 11/03/2018   MCV 84.6 11/03/2018   PLT 210 11/03/2018   No flowsheet data found.  Discharge instruction: per After Visit Summary and "Baby and Me Booklet".  After visit meds:  Allergies as of 11/03/2018   No Known Allergies     Medication List    TAKE these medications   ibuprofen 600 MG tablet Commonly known as:  ADVIL,MOTRIN Take 1 tablet (600 mg total) by mouth every 6 (six) hours.   Prenatal Vitamin 27-0.8 MG Tabs Take 1 tablet by mouth daily.       Diet: routine diet  Activity: Advance as tolerated. Pelvic rest for 6 weeks.   Outpatient follow up:4 weeks Follow up Appt: Future Appointments  Date Time Provider Department  Center  12/06/2018  9:35 AM Armando ReichertHogan, Heather D, CNM [facility]    Newborn Data: Live born female  Birth Weight: 7 lb 4.8 oz (3311 g) APGAR: 6, 8  Newborn Delivery   Birth date/time:  11/02/2018 02:56:00 Delivery type:  Vaginal, Spontaneous     Baby Feeding: Breast Disposition:home with mother   11/04/2018 Colleen AbbotNimeka Skyy Mcknight, MD

## 2018-11-08 ENCOUNTER — Telehealth: Payer: Self-pay

## 2018-11-08 ENCOUNTER — Telehealth: Payer: Self-pay | Admitting: *Deleted

## 2018-11-08 NOTE — Telephone Encounter (Signed)
Message received from pt's husband on behalf of pt. He stated that their baby was born on 12/24 and they would like information about circumcision outside of the hospital. Please call back.

## 2018-11-08 NOTE — Telephone Encounter (Signed)
Called Pts husband back regarding places to get son Circumcised. Mr Roseanne RenoHassan states was given a name by Pediatrician, has already made an appointment for him to get procedure done.

## 2018-11-16 ENCOUNTER — Encounter: Payer: Self-pay | Admitting: *Deleted

## 2018-12-06 ENCOUNTER — Telehealth: Payer: Self-pay | Admitting: Advanced Practice Midwife

## 2018-12-06 ENCOUNTER — Ambulatory Visit: Payer: Medicaid Other | Admitting: Advanced Practice Midwife

## 2018-12-06 NOTE — Telephone Encounter (Signed)
Called pt w/ interpretor to get rescheduled for her pp visit. No answer, LVM to give the office a call back to get rescheduled.

## 2019-01-13 ENCOUNTER — Ambulatory Visit (INDEPENDENT_AMBULATORY_CARE_PROVIDER_SITE_OTHER): Payer: Self-pay | Admitting: Obstetrics and Gynecology

## 2019-01-13 ENCOUNTER — Encounter: Payer: Self-pay | Admitting: Obstetrics and Gynecology

## 2019-01-13 DIAGNOSIS — Z3041 Encounter for surveillance of contraceptive pills: Secondary | ICD-10-CM | POA: Insufficient documentation

## 2019-01-13 LAB — POCT PREGNANCY, URINE: Preg Test, Ur: NEGATIVE

## 2019-01-13 MED ORDER — NORETHINDRONE 0.35 MG PO TABS: 1.0000 | ORAL_TABLET | Freq: Every day | ORAL | 11 refills | Status: DC

## 2019-01-13 MED FILL — NORETHINDRONE 0.35 MG TAB: 0.35 | 28 days supply | Qty: 28 | Fill #0

## 2019-01-13 NOTE — Progress Notes (Signed)
Subjective:     Colleen Sampson is a 23 y.o. female who presents for a postpartum visit. She is several weeks postpartum following a spontaneous vaginal delivery. I have fully reviewed the prenatal and intrapartum course. The delivery was at 40.5 gestational weeks. Outcome: spontaneous vaginal delivery. Anesthesia: epidural. Postpartum course has been unremarkable. Baby's course has been unremarkable. Baby is feeding by breast. Bleeding no bleeding. Bowel function is normal. Bladder function is normal. Patient is sexually active. Contraception method is OCP (estrogen/progesterone). Postpartum depression screening: negative.  The following portions of the patient's history were reviewed and updated as appropriate: allergies, current medications, past family history, past medical history, past social history, past surgical history and problem list.  Review of Systems Pertinent items are noted in HPI.   Objective:    BP 111/87   Pulse 79   Wt 163 lb 4.8 oz (74.1 kg)   LMP 01/21/2018 (Exact Date)   BMI 27.17 kg/m   General:  alert  Lungs: clear to auscultation bilaterally  Heart:  regular rate and rhythm, S1, S2 normal, no murmur, click, rub or gallop  Abdomen: soft, non-tender; bowel sounds normal; no masses,  no organomegaly        Assessment:   Normal postpartum exam. Pap smear not done at today's visit.  Last Pap 05/2018 and it was normal.   Plan:   1. Contraception: oral progesterone-only contraceptive 2. Pregnancy test negative  3. Follow up in: July 2020 for annual exam   Rasch, Harolyn Rutherford, NP 01/13/2019 8:44 AM

## 2019-05-11 MED FILL — NORETHINDRONE 0.35 MG TAB: 0.35 | 28 days supply | Qty: 28 | Fill #1

## 2019-09-18 IMAGING — US US MFM OB COMP +14 WKS
1 series · 14 of 28 positions shown · non-contrast
Comparison: none

[Series 1: us mfm ob comp +14 wks · 64 acquisitions, 14 frames shown]
[im 3/64]
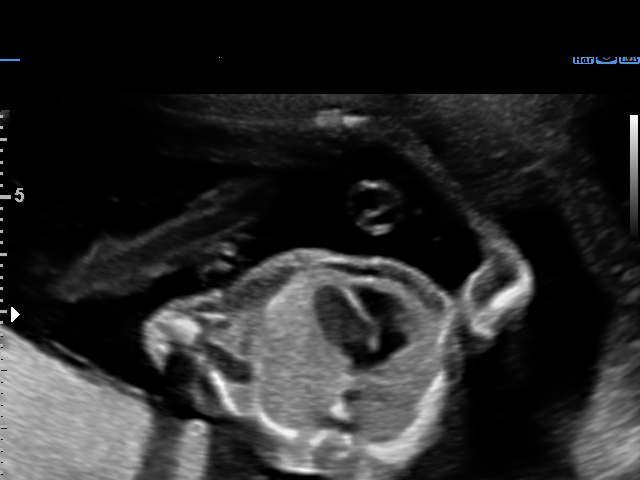
[im 8/64]
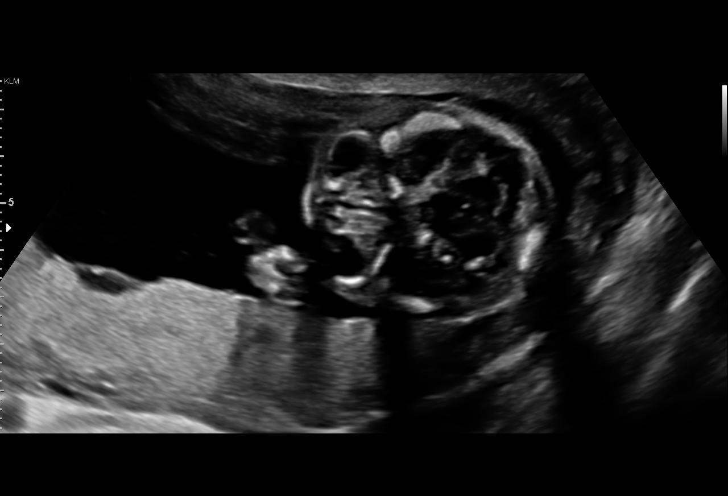
[im 12/64]
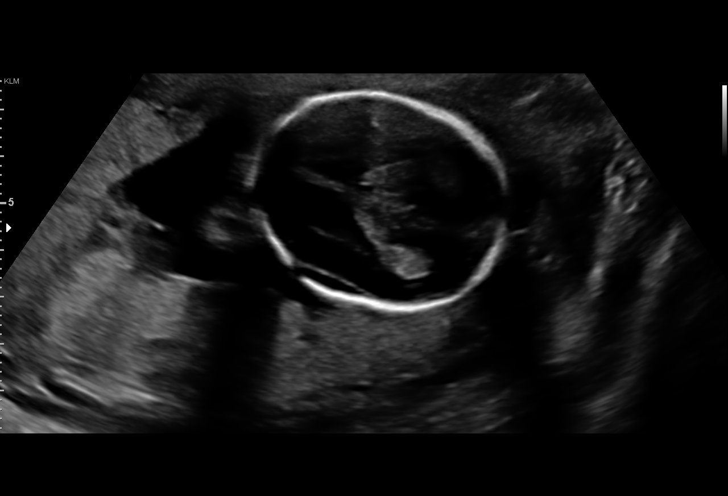
[im 17/64]
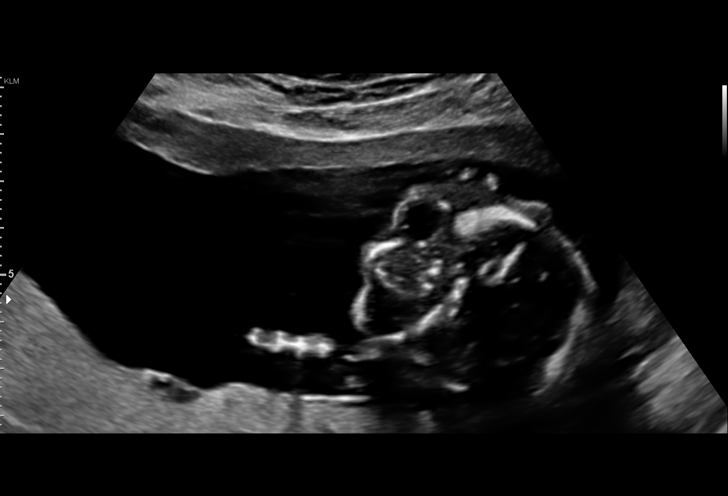
[im 22/64]
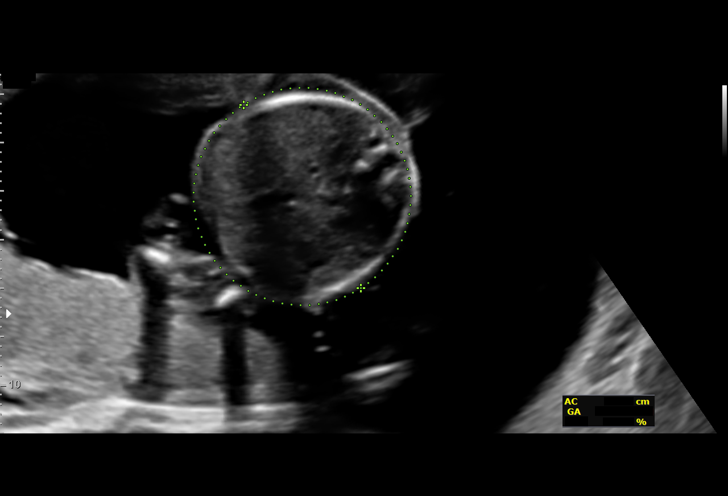
[im 26/64]
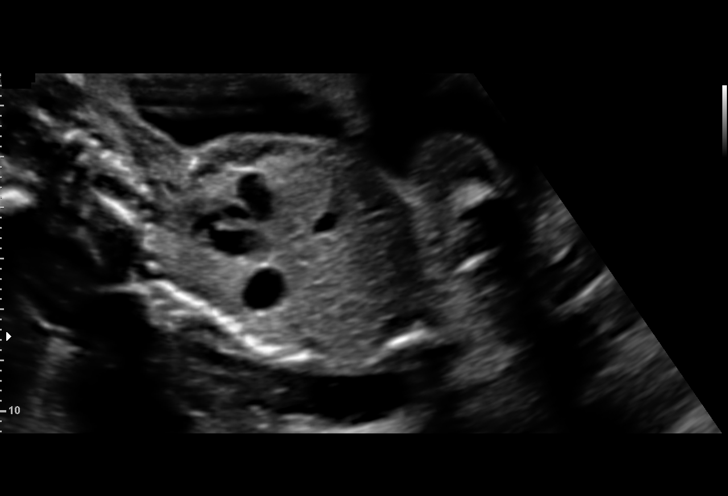
[im 31/64]
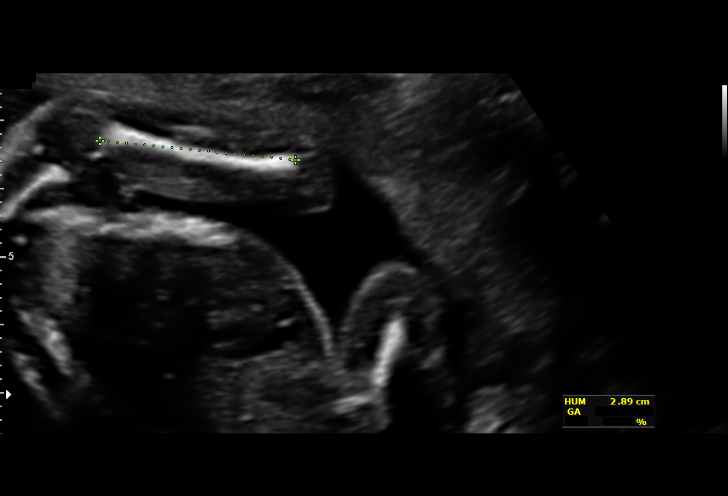
[im 36/64]
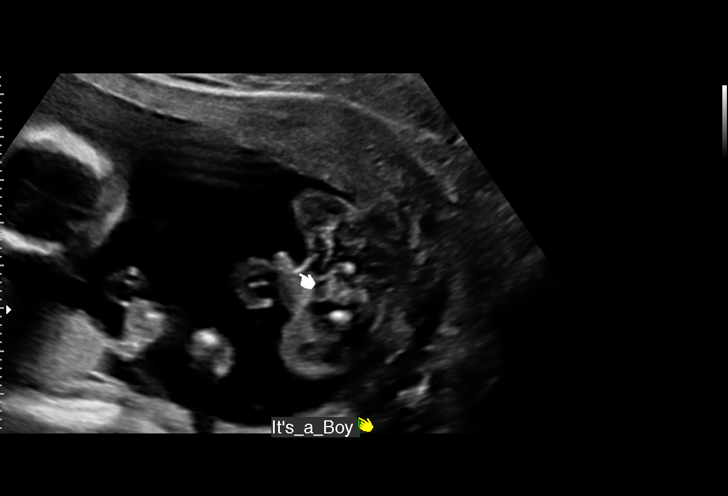
[im 40/64]
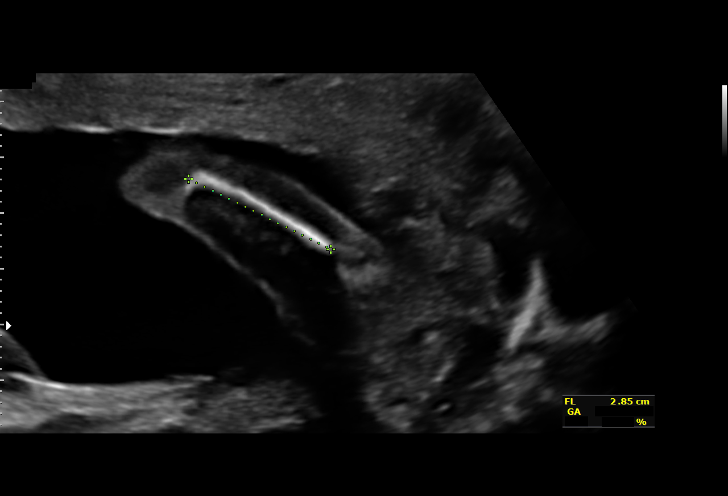
[im 45/64]
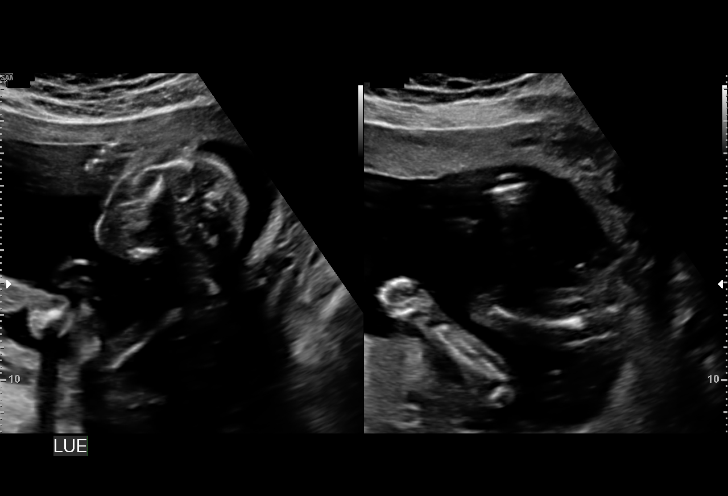
[im 50/64]
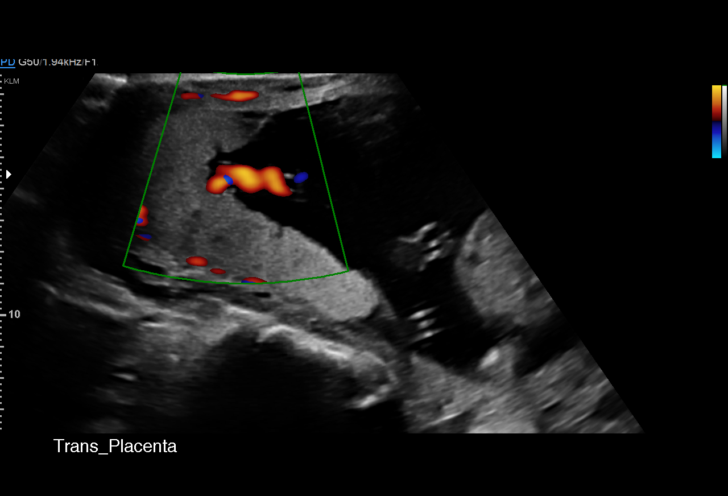
[im 54/64]
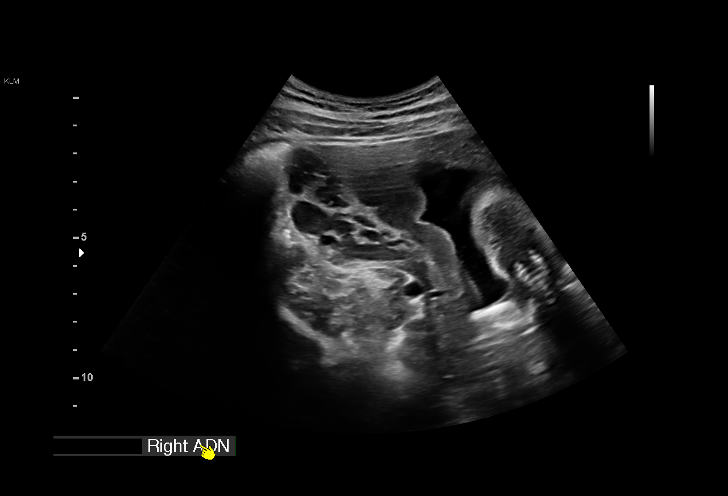
[im 59/64]
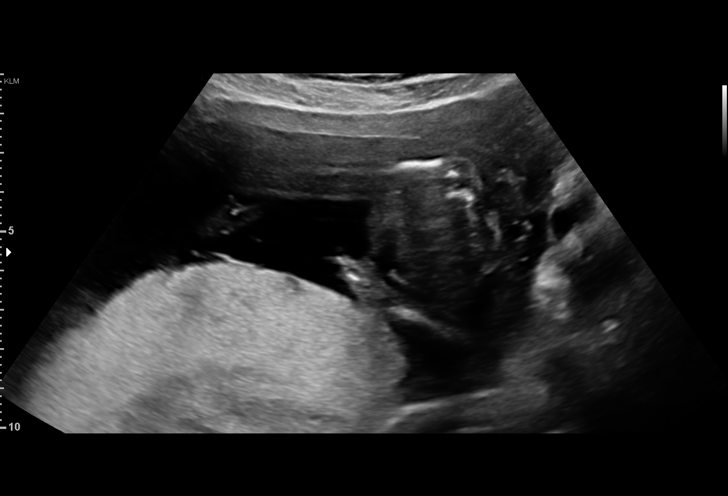
[im 64/64]
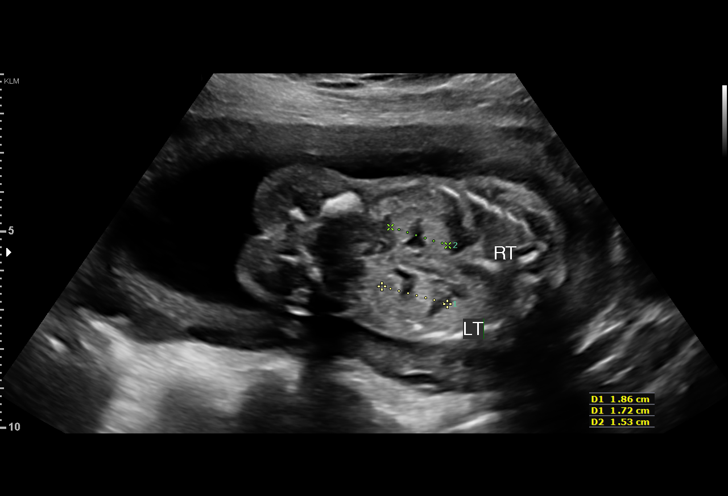

[14 of 28 positions shown; findings below may reference images not displayed]

Indications

19 weeks gestation of pregnancy
Encounter for antenatal screening for
malformations (low risk NIPS)
OB History

Gravidity:    1         Term:   0        Prem:   0        SAB:   0
TOP:          0       Ectopic:  0        Living: 0
Fetal Evaluation

Num Of Fetuses:     1
Fetal Heart         143
Rate(bpm):
Cardiac Activity:   Observed
Presentation:       Variable
Placenta:           Posterior
P. Cord Insertion:  Visualized

Amniotic Fluid
AFI FV:      Subjectively within normal limits
Biometry

BPD:      44.8  mm     G. Age:  19w 4d         74  %    CI:         74.3   %    70 - 86
FL/HC:      17.4   %    16.1 -
HC:       165   mm     G. Age:  19w 1d         53  %    HC/AC:      1.14        1.09 -
AC:      144.4  mm     G. Age:  19w 5d         71  %    FL/BPD:     64.1   %
FL:       28.7  mm     G. Age:  18w 6d         37  %    FL/AC:      19.9   %    20 - 24
HUM:      28.9  mm     G. Age:  19w 3d         60  %
CER:      21.2  mm     G. Age:  20w 1d         77  %

Est. FW:     286  gm    0 lb 10 oz      51  %
Gestational Age

LMP:           19w 0d        Date:  01/21/18                 EDD:   10/28/18
U/S Today:     19w 2d                                        EDD:   10/26/18
Best:          19w 0d     Det. By:  LMP  (01/21/18)          EDD:   10/28/18
Anatomy

Cranium:               Appears normal         Aortic Arch:            Appears normal
Cavum:                 Appears normal         Ductal Arch:            Appears normal
Ventricles:            Appears normal         Diaphragm:              Appears normal
Choroid Plexus:        Appears normal         Stomach:                Appears normal, left
sided
Cerebellum:            Appears normal         Abdomen:                Appears normal
Posterior Fossa:       Appears normal         Abdominal Wall:         Appears nml (cord
insert, abd wall)
Nuchal Fold:           Appears normal         Cord Vessels:           Appears normal (3
vessel cord)
Face:                  Appears normal         Kidneys:                Appear normal
(orbits and profile)
Lips:                  Appears normal         Bladder:                Appears normal
Thoracic:              Appears normal         Spine:                  Ltd views no
intracranial signs of
NT
Heart:                 Appears normal         Upper Extremities:      Appears normal
(4CH, axis, and situs
RVOT:                  Appears normal         Lower Extremities:      Appears normal
LVOT:                  Appears normal

Other:  Parents do not wish to know sex of fetus. Heels and 5th digit
visualized. Nasal bone visualized.
Impression

We performed fetal anatomy scan. No makers of
aneuploidies or fetal structural defects are seen. Fetal
biometry is consistent with her previously-established dates.
Amniotic fluid is normal and good fetal activity is seen.

On cell-free fetal DNA screening, the risks of fetal
aneuploidies are not increased.
Recommendations

An appointment was made for her to return in 4 weeks for
completion of fetal anatomy (fetal spine).

## 2019-10-20 IMAGING — US US MFM OB FOLLOW-UP
1 series · 14 of 28 positions shown · non-contrast
Comparison: none

[Series 1: us mfm ob follow-up · 47 acquisitions, 14 frames shown]
[im 2/47]
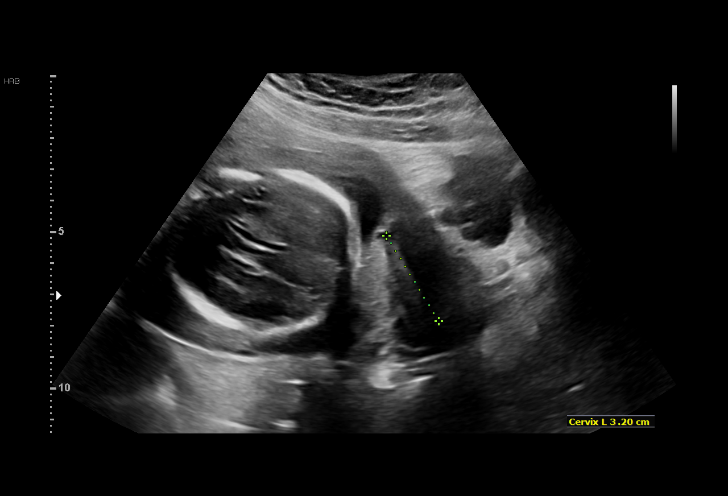
[im 6/47]
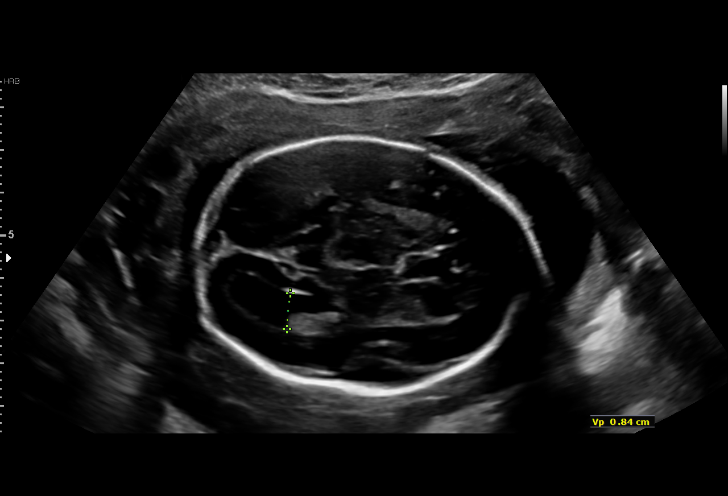
[im 9/47]
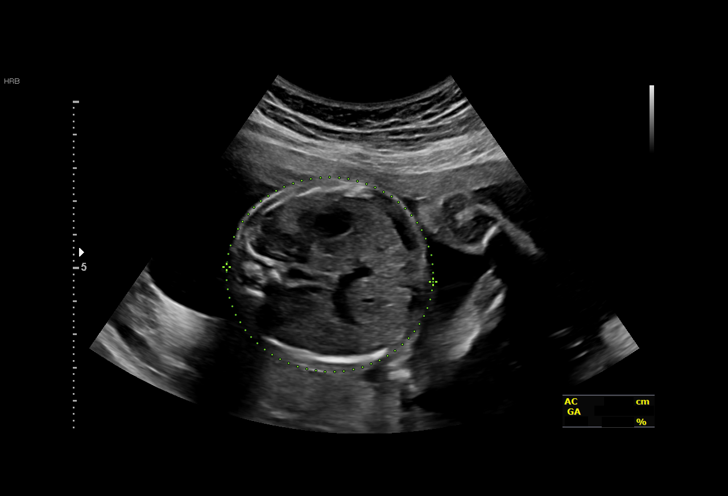
[im 12/47]
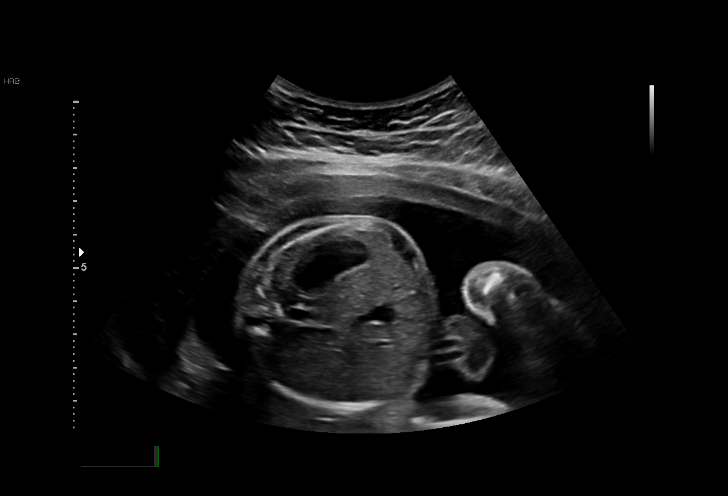
[im 16/47]
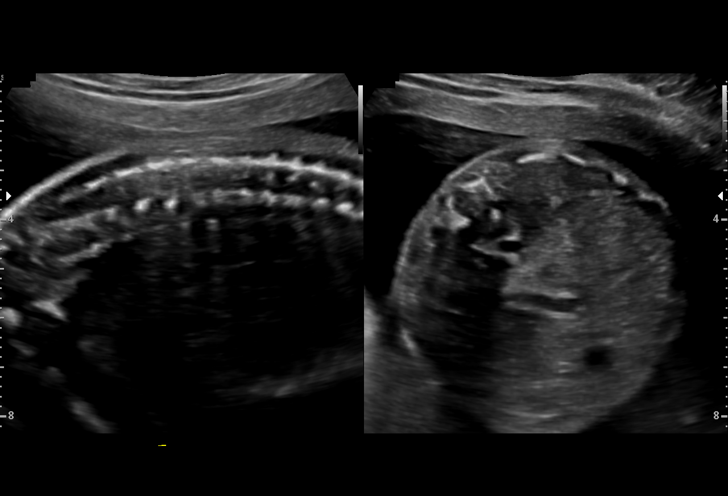
[im 19/47]
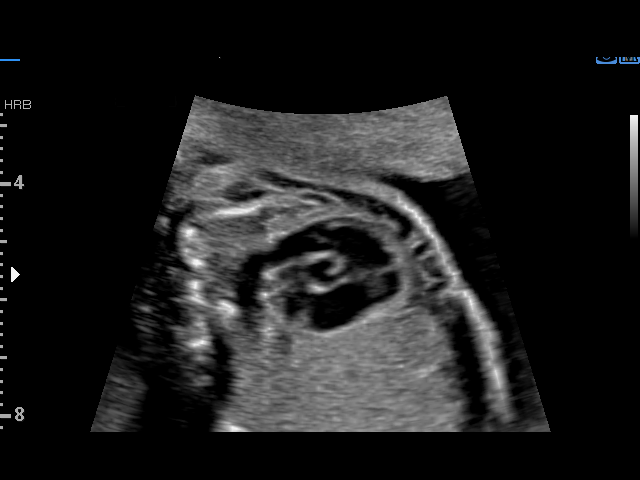
[im 23/47]
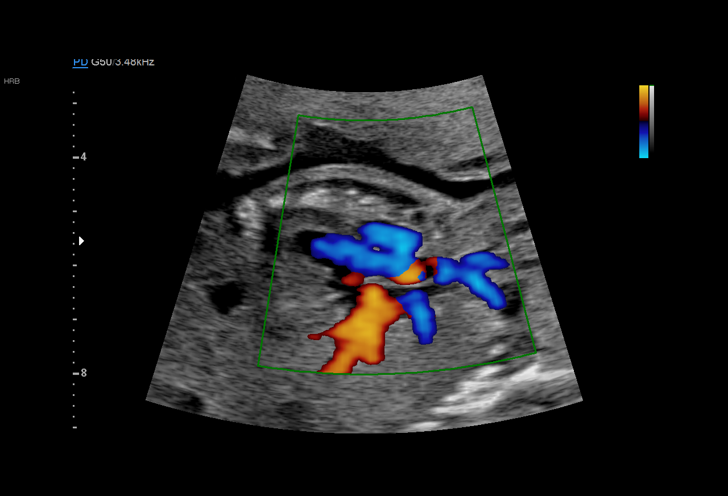
[im 26/47]
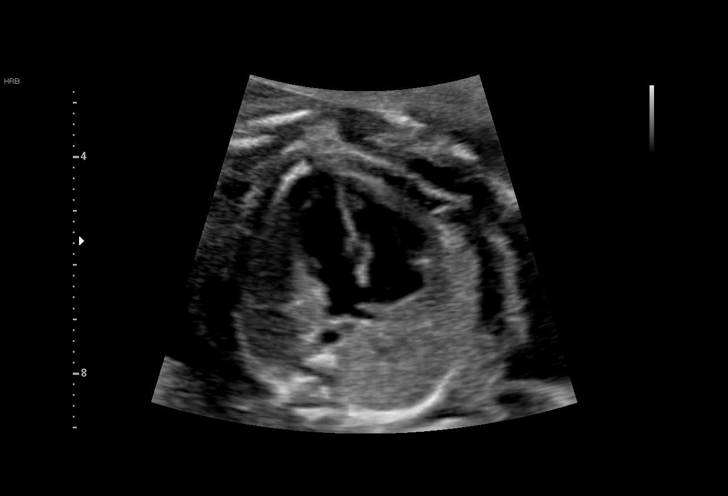
[im 29/47]
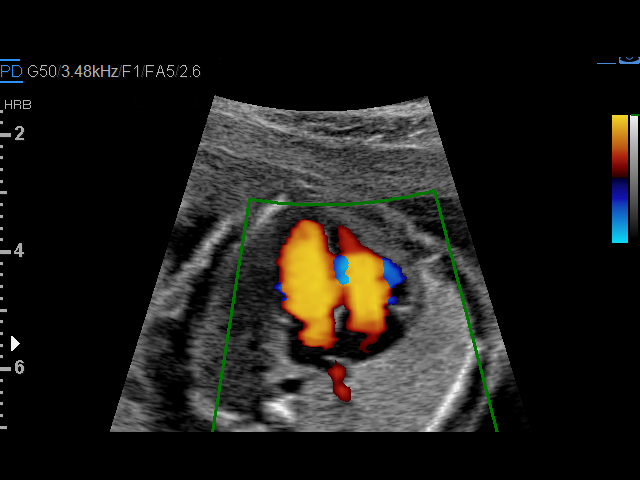
[im 33/47]
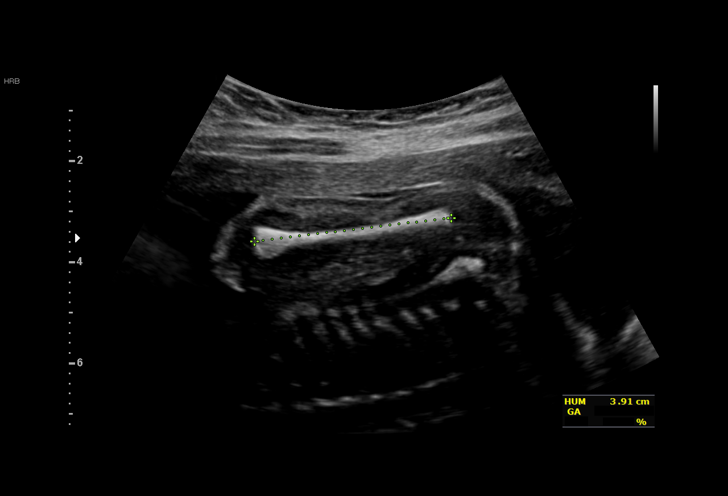
[im 36/47]
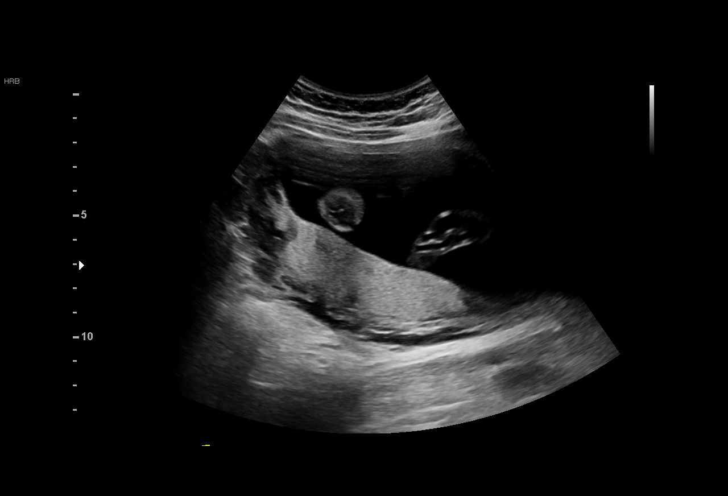
[im 40/47]
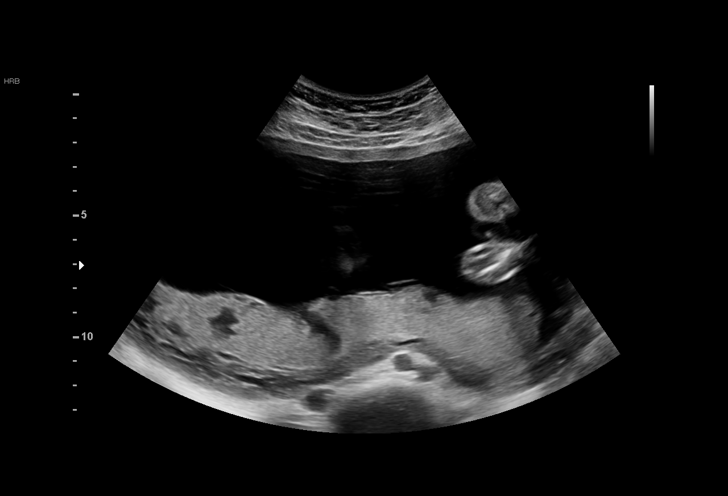
[im 43/47]
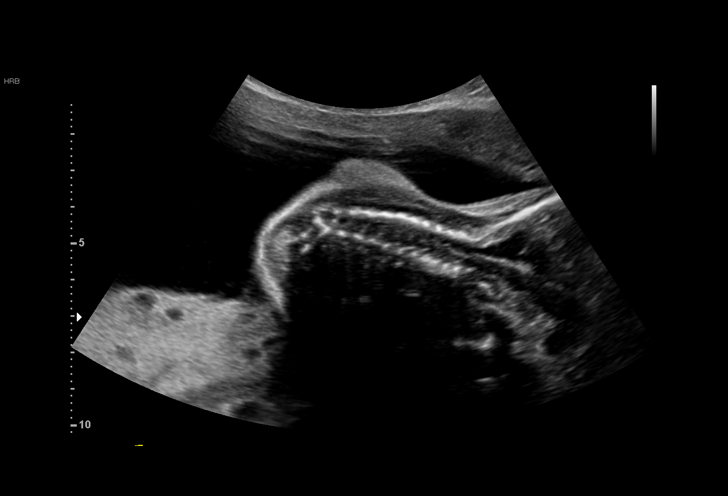
[im 47/47]
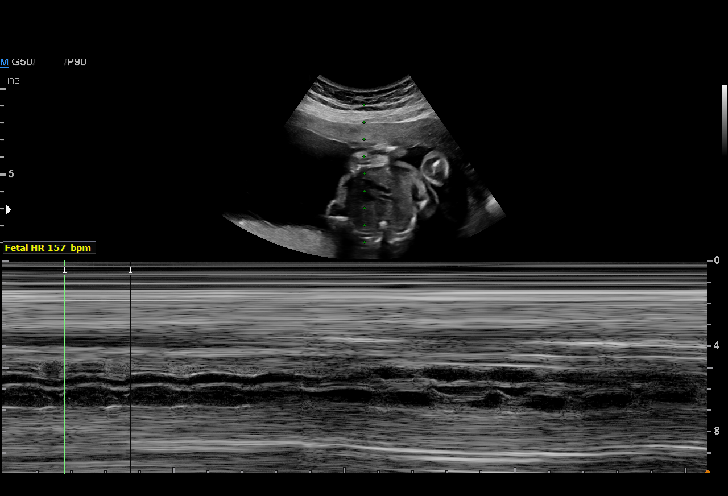

[14 of 28 positions shown; findings below may reference images not displayed]

Indications

23 weeks gestation of pregnancy
Encounter for other antenatal screening
follow-up
Vital Signs

BMI:
Fetal Evaluation

Num Of Fetuses:         1
Fetal Heart Rate(bpm):  157
Cardiac Activity:       Observed
Presentation:           Cephalic
Placenta:               Posterior
P. Cord Insertion:      Previously Visualized

Amniotic Fluid
AFI FV:      Within normal limits

Largest Pocket(cm)
5.9
Biometry

BPD:        58  mm     G. Age:  23w 6d         52  %    CI:        69.02   %    70 - 86
FL/HC:      18.5   %    18.7 -
HC:       223   mm     G. Age:  24w 2d         63  %    HC/AC:      1.16        1.05 -
AC:      191.7  mm     G. Age:  23w 6d         52  %    FL/BPD:     71.0   %    71 - 87
FL:       41.2  mm     G. Age:  23w 3d         32  %    FL/AC:      21.5   %    20 - 24
HUM:      39.1  mm     G. Age:  24w 0d         50  %
LV:        8.4  mm
Est. FW:     624  gm      1 lb 6 oz     55  %
OB History

Gravidity:    1         Term:   0        Prem:   0        SAB:   0
TOP:          0       Ectopic:  0        Living: 0
Gestational Age

LMP:           23w 4d        Date:  01/21/18                 EDD:   10/28/18
U/S Today:     23w 6d                                        EDD:   10/26/18
Best:          23w 4d     Det. By:  LMP  (01/21/18)          EDD:   10/28/18
Anatomy

Cranium:               Appears normal         Aortic Arch:            Previously seen
Cavum:                 Appears normal         Ductal Arch:            Previously seen
Ventricles:            Appears normal         Diaphragm:              Appears normal
Choroid Plexus:        Previously seen        Stomach:                Appears normal, left
sided
Cerebellum:            Previously seen        Abdomen:                Appears normal
Posterior Fossa:       Previously seen        Abdominal Wall:         Previously seen
Nuchal Fold:           Previously seen        Cord Vessels:           Not well visualized
Face:                  Orbits and profile     Kidneys:                Appear normal
previously seen
Lips:                  Previously seen        Bladder:                Appears normal
Thoracic:              Appears normal         Spine:                  Appears normal
Heart:                 Appears normal         Upper Extremities:      Previously seen
(4CH, axis, and situs
RVOT:                  Appears normal         Lower Extremities:      Previously seen
LVOT:                  Appears normal

Other:  Parents do not wish to know sex of fetus. Heels and 5th digit
previously visualized. Nasal bone previously visualized.
Cervix Uterus Adnexa

Cervix
Length:            3.2  cm.
Normal appearance by transabdominal scan.
Impression

Patient returned for completion of fetal anatomy. Amniotic
fluid is normal and good fetal activity is seen. Fetal growth is
appropriate for gestational age.
Fetal spine appears normal.
Recommendations

Follow-up scans as clinically indicated.

## 2019-11-11 NOTE — L&D Delivery Note (Signed)
Delivery Note Called to room and patient was complete and pushing. At 7:37 AM a viable female was delivered via Vaginal, Spontaneous (Presentation: Left Occiput Anterior).  Loose nuchal cord x1 noted, delivered through. APGAR: 9, 9; weight pending.   Placenta status: Spontaneous, Intact.  Cord: 3 vessels with the following complications: None.   Anesthesia: Epidural Episiotomy: None Lacerations: Periurethral;left Sulcus Suture Repair: 3.0 vicryl Est. Blood Loss (mL): 200  Mom to postpartum.  Baby to Couplet care / Skin to Skin.  Alric Seton 07/25/2020, 8:07 AM

## 2020-03-14 ENCOUNTER — Other Ambulatory Visit: Payer: Self-pay

## 2020-03-14 ENCOUNTER — Encounter: Payer: Self-pay | Admitting: Family Medicine

## 2020-03-14 ENCOUNTER — Ambulatory Visit (INDEPENDENT_AMBULATORY_CARE_PROVIDER_SITE_OTHER): Payer: Self-pay

## 2020-03-14 DIAGNOSIS — Z3201 Encounter for pregnancy test, result positive: Secondary | ICD-10-CM

## 2020-03-14 LAB — POCT PREGNANCY, URINE: Preg Test, Ur: POSITIVE — AB

## 2020-03-14 NOTE — Progress Notes (Signed)
Pt here today for pregnancy test resulting positive.  With Video Interpreter # [phone] pt reports LMP 10/19/19 which makes her 21wks with EDD 07/25/20.  and that her periods are irregular.  Pt also reports that she breastfeed until last month.  Pt denies any pain or vaginal bleeding.  Medication/allergies reviewed.  Pt reports that she is going out of town leaving in a couple of days.  I informed pt that the front office will provide her with a proof of pregnancy letter to start prenatal care.  I encouraged pt that she needs to start prenatal asap as that she is already over half way through her pregnancy.  Pt verbalized understanding with no further questions.    Addison Naegeli, RN  03/15/20

## 2020-03-15 NOTE — Progress Notes (Signed)
Patient seen and assessed by nursing staff.  Agree with documentation and plan.  

## 2020-03-27 ENCOUNTER — Encounter: Payer: Self-pay | Admitting: Student

## 2020-03-27 ENCOUNTER — Telehealth: Payer: Self-pay | Admitting: Obstetrics and Gynecology

## 2020-03-27 DIAGNOSIS — Z3490 Encounter for supervision of normal pregnancy, unspecified, unspecified trimester: Secondary | ICD-10-CM | POA: Insufficient documentation

## 2020-03-27 NOTE — Telephone Encounter (Signed)
Spoke with patient using Interpreter about her missed appointment. She stated she was out of the country, and will call when she gets back in town.

## 2020-04-10 ENCOUNTER — Telehealth: Payer: Self-pay | Admitting: Family Medicine

## 2020-04-10 NOTE — Telephone Encounter (Signed)
Spoke to patient's husband in regards to getting patient scheduled for prenatal care. Patient's husband stated that she is currently out of the country and will be back the first week of July. Patient's husband instructed that I will be giving her a call back around that time. Patient's husband verbalized understanding.

## 2020-04-16 ENCOUNTER — Encounter: Payer: Self-pay | Admitting: Nurse Practitioner

## 2020-06-11 ENCOUNTER — Ambulatory Visit (INDEPENDENT_AMBULATORY_CARE_PROVIDER_SITE_OTHER): Payer: 59 | Admitting: Medical

## 2020-06-11 ENCOUNTER — Other Ambulatory Visit: Payer: Self-pay

## 2020-06-11 ENCOUNTER — Encounter: Payer: Self-pay | Admitting: Medical

## 2020-06-11 VITALS — BP 99/66 | HR 97 | Wt 153.3 lb

## 2020-06-11 DIAGNOSIS — Z3490 Encounter for supervision of normal pregnancy, unspecified, unspecified trimester: Secondary | ICD-10-CM

## 2020-06-11 DIAGNOSIS — Z3689 Encounter for other specified antenatal screening: Secondary | ICD-10-CM

## 2020-06-11 DIAGNOSIS — Z3A33 33 weeks gestation of pregnancy: Secondary | ICD-10-CM

## 2020-06-11 DIAGNOSIS — Z23 Encounter for immunization: Secondary | ICD-10-CM | POA: Diagnosis not present

## 2020-06-11 DIAGNOSIS — O0933 Supervision of pregnancy with insufficient antenatal care, third trimester: Secondary | ICD-10-CM | POA: Insufficient documentation

## 2020-06-11 NOTE — Patient Instructions (Addendum)
If you are in need of transportation to get to and from your appointments in our office.  You can reach Transportation Services by calling [phone] Monday - Friday  7am-6pm.  Safe Medications in Pregnancy   Acne:  Benzoyl Peroxide  Salicylic Acid   Backache/Headache:  Tylenol: 2 regular strength every 4 hours OR        2 Extra strength every 6 hours   Colds/Coughs/Allergies:  Benadryl (alcohol free) 25 mg every 6 hours as needed  Breath right strips  Claritin  Cepacol throat lozenges  Chloraseptic throat spray  Cold-Eeze- up to three times per day  Cough drops, alcohol free  Flonase (by prescription only)  Guaifenesin  Mucinex  Robitussin DM (plain only, alcohol free)  Saline nasal spray/drops  Sudafed (pseudoephedrine) & Actifed * use only after [redacted] weeks gestation and if you do not have high blood pressure  Tylenol  Vicks Vaporub  Zinc lozenges  Zyrtec   Constipation:  Colace  Ducolax suppositories  Fleet enema  Glycerin suppositories  Metamucil  Milk of magnesia  Miralax  Senokot  Smooth move tea   Diarrhea:  Kaopectate  Imodium A-D   *NO pepto Bismol   Hemorrhoids:  Anusol  Anusol HC  Preparation H  Tucks   Indigestion:  Tums  Maalox  Mylanta  Zantac  Pepcid   Insomnia:  Benadryl (alcohol free) 25mg  every 6 hours as needed  Tylenol PM  Unisom, no Gelcaps   Leg Cramps:  Tums  MagGel   Nausea/Vomiting:  Bonine  Dramamine  Emetrol  Ginger extract  Sea bands  Meclizine  Nausea medication to take during pregnancy:  Unisom (doxylamine succinate 25 mg tablets) Take one tablet daily at bedtime. If symptoms are not adequately controlled, the dose can be increased to a maximum recommended dose of two tablets daily (1/2 tablet in the morning, 1/2 tablet mid-afternoon and one at bedtime).  Vitamin B6 100mg  tablets. Take one tablet twice a day (up to 200 mg per day).   Skin Rashes:  Aveeno products  Benadryl cream or 25mg  every 6  hours as needed  Calamine Lotion  1% cortisone cream   Yeast infection:  Gyne-lotrimin 7  Monistat 7    **If taking multiple medications, please check labels to avoid duplicating the same active ingredients  **take medication as directed on the label  ** Do not exceed 4000 mg of tylenol in 24 hours  **Do not take medications that contain aspirin or ibuprofen         o

## 2020-06-11 NOTE — Progress Notes (Signed)
   PRENATAL VISIT NOTE  Subjective:  Colleen Sampson is a 24 y.o. G2P1001 at [redacted]w[redacted]d being seen today for her first prenatal visit for this pregnancy.  She is currently monitored for the following issues for this high-risk pregnancy and has Language barrier; History of oligohydramnios; Encounter for supervision of low-risk pregnancy, antepartum; and Late prenatal care affecting pregnancy, antepartum, third trimester on their problem list.  Patient reports no complaints.  Contractions: Not present. Vag. Bleeding: None.  Movement: Present. Denies leaking of fluid.   She is planning to breastfeed. Desires nexplanon for contraception.   The following portions of the patient's history were reviewed and updated as appropriate: allergies, current medications, past family history, past medical history, past social history, past surgical history and problem list.   Objective:   Vitals:   06/11/20 1333  BP: 99/66  Pulse: 97  Weight: 153 lb 4.8 oz (69.5 kg)    Fetal Status: Fetal Heart Rate (bpm): 141 Fundal Height: 30 cm Movement: Present     General:  Alert, oriented and cooperative. Patient is in no acute distress.  Skin: Skin is warm and dry. No rash noted.   Cardiovascular: Normal heart rate and rhythm noted  Respiratory: Normal respiratory effort, no problems with respiration noted. Clear to auscultation.   Abdomen: Soft, gravid, appropriate for gestational age. Normal bowel sounds. Non-tender. Pain/Pressure: Present     Pelvic: Cervical exam deferred        Extremities: Normal range of motion.  Edema: None  Mental Status: Normal mood and affect. Normal behavior. Normal judgment and thought content.   Assessment and Plan:  Pregnancy: G2P1001 at [redacted]w[redacted]d 1. Encounter for supervision of low-risk pregnancy, antepartum - Glucose Tolerance, 1 Hour - Culture, OB Urine - Genetic Screening - [employer] and Horizon - CBC/D/Plt+RPR+Rh+ABO+Rub Ab... - Tdap vaccine greater than or equal to 7yo IM - [place]  MFM OB DETAIL +14 WK; scheduled - Will defer GC/Chlamydia to next visit at 36 weeks when GBS collection is needed   2. Late prenatal care - First visit today at 33 weeks   Preterm labor/first trimester warning symptoms and general obstetric precautions including but not limited to vaginal bleeding, contractions, leaking of fluid and fetal movement were reviewed in detail with the patient. Please refer to After Visit Summary for other counseling recommendations.   Discussed the normal visit cadence for prenatal care Discussed the nature of our practice with multiple providers including residents and students   Return in about 2 weeks (around 06/25/2020) for LOB, In-Person.  No future appointments.  Vonzella Nipple, PA-C

## 2020-06-12 LAB — CBC/D/PLT+RPR+RH+ABO+RUB AB...
Antibody Screen: NEGATIVE
Basophils Absolute: 0 10*3/uL (ref 0.0–0.2)
Basos: 0 %
EOS (ABSOLUTE): 0.1 10*3/uL (ref 0.0–0.4)
Eos: 2 %
HCV Ab: <0.1 s/co ratio (ref 0.0–0.9)
HIV Screen 4th Generation wRfx: NONREACTIVE
Hematocrit: 34.5 % (ref 34.0–46.6)
Hemoglobin: 11.7 g/dL (ref 11.1–15.9)
Hepatitis B Surface Ag: NEGATIVE
Immature Grans (Abs): 0 10*3/uL (ref 0.0–0.1)
Immature Granulocytes: 0 %
Lymphocytes Absolute: 1.5 10*3/uL (ref 0.7–3.1)
Lymphs: 24 %
MCH: 27.9 pg (ref 26.6–33.0)
MCHC: 33.9 g/dL (ref 31.5–35.7)
MCV: 82 fL (ref 79–97)
Monocytes Absolute: 0.6 10*3/uL (ref 0.1–0.9)
Monocytes: 10 %
Neutrophils Absolute: 3.8 10*3/uL (ref 1.4–7.0)
Neutrophils: 64 %
Platelets: 225 10*3/uL (ref 150–450)
RBC: 4.19 x10E6/uL (ref 3.77–5.28)
RDW: 12.4 % (ref 11.7–15.4)
RPR Ser Ql: NONREACTIVE
Rh Factor: POSITIVE
Rubella Antibodies, IGG: 18.4 index (ref >0.99)
WBC: 6 10*3/uL (ref 3.4–10.8)

## 2020-06-12 LAB — POCT URINALYSIS DIP (DEVICE)
Bilirubin Urine: NEGATIVE
Glucose, UA: NEGATIVE mg/dL
Hgb urine dipstick: NEGATIVE
Ketones, ur: NEGATIVE mg/dL
Leukocytes,Ua: NEGATIVE
Nitrite: NEGATIVE
Protein, ur: NEGATIVE mg/dL
Specific Gravity, Urine: 1.025 (ref 1.005–1.030)
Urobilinogen, UA: 0.2 mg/dL (ref 0.0–1.0)
pH: 6 (ref 5.0–8.0)

## 2020-06-12 LAB — HCV INTERPRETATION

## 2020-06-12 LAB — GLUCOSE TOLERANCE, 1 HOUR: Glucose, 1Hr PP: 77 mg/dL (ref 65–199)

## 2020-06-13 LAB — CULTURE, OB URINE

## 2020-06-13 LAB — URINE CULTURE, OB REFLEX

## 2020-06-15 ENCOUNTER — Ambulatory Visit: Payer: 59 | Attending: Medical

## 2020-06-15 ENCOUNTER — Ambulatory Visit: Payer: 59 | Admitting: *Deleted

## 2020-06-15 ENCOUNTER — Other Ambulatory Visit: Payer: Self-pay

## 2020-06-15 ENCOUNTER — Encounter: Payer: Self-pay | Admitting: *Deleted

## 2020-06-15 DIAGNOSIS — Z3490 Encounter for supervision of normal pregnancy, unspecified, unspecified trimester: Secondary | ICD-10-CM

## 2020-06-15 DIAGNOSIS — Z3689 Encounter for other specified antenatal screening: Secondary | ICD-10-CM | POA: Insufficient documentation

## 2020-06-26 ENCOUNTER — Other Ambulatory Visit: Payer: Self-pay

## 2020-06-26 ENCOUNTER — Ambulatory Visit (INDEPENDENT_AMBULATORY_CARE_PROVIDER_SITE_OTHER): Payer: 59 | Admitting: Obstetrics and Gynecology

## 2020-06-26 VITALS — BP 104/72 | HR 94 | Wt 155.7 lb

## 2020-06-26 DIAGNOSIS — Z3A35 35 weeks gestation of pregnancy: Secondary | ICD-10-CM

## 2020-06-26 DIAGNOSIS — Z789 Other specified health status: Secondary | ICD-10-CM

## 2020-06-26 DIAGNOSIS — Z8759 Personal history of other complications of pregnancy, childbirth and the puerperium: Secondary | ICD-10-CM

## 2020-06-26 DIAGNOSIS — Z3490 Encounter for supervision of normal pregnancy, unspecified, unspecified trimester: Secondary | ICD-10-CM

## 2020-06-26 DIAGNOSIS — O0933 Supervision of pregnancy with insufficient antenatal care, third trimester: Secondary | ICD-10-CM

## 2020-06-26 NOTE — Progress Notes (Signed)
   PRENATAL VISIT NOTE  Subjective:  Colleen Sampson is a 24 y.o. G2P1001 at [redacted]w[redacted]d being seen today for ongoing prenatal care.  She is currently monitored for the following issues for this low-risk pregnancy and has Language barrier; History of oligohydramnios; Encounter for supervision of low-risk pregnancy, antepartum; and Late prenatal care affecting pregnancy, antepartum, third trimester on their problem list.  Patient doing well with no acute concerns today. She reports pelvic pressure.  Contractions: Not present. Vag. Bleeding: None.  Movement: Present. Denies leaking of fluid.   The following portions of the patient's history were reviewed and updated as appropriate: allergies, current medications, past family history, past medical history, past social history, past surgical history and problem list. Problem list updated.  Objective:   Vitals:   06/26/20 1538  BP: 104/72  Pulse: 94  Weight: 155 lb 11.2 oz (70.6 kg)    Fetal Status: Fetal Heart Rate (bpm): 143 Fundal Height: 35 cm Movement: Present     General:  Alert, oriented and cooperative. Patient is in no acute distress.  Skin: Skin is warm and dry. No rash noted.   Cardiovascular: Normal heart rate noted  Respiratory: Normal respiratory effort, no problems with respiration noted  Abdomen: Soft, gravid, appropriate for gestational age.  Pain/Pressure: Present     Pelvic: Cervical exam deferred        Extremities: Normal range of motion.  Edema: None  Mental Status:  Normal mood and affect. Normal behavior. Normal judgment and thought content.   Assessment and Plan:  Pregnancy: G2P1001 at [redacted]w[redacted]d  1. Language barrier Interpreter present  2. History of oligohydramnios Recent AFI on 06/15/2020 was normal  3. Encounter for supervision of low-risk pregnancy, antepartum   4. Late prenatal care affecting pregnancy, antepartum, third trimester 36 week labs next visit.  Other labs reviewed and patient is up to date  Preterm  labor symptoms and general obstetric precautions including but not limited to vaginal bleeding, contractions, leaking of fluid and fetal movement were reviewed in detail with the patient.  Please refer to After Visit Summary for other counseling recommendations.   Return in about 1 week (around 07/03/2020) for ROB, in person, 36 weeks swabs.   Mariel Aloe, MD

## 2020-06-28 ENCOUNTER — Encounter: Payer: Self-pay | Admitting: General Practice

## 2020-07-04 ENCOUNTER — Telehealth: Payer: Self-pay

## 2020-07-04 ENCOUNTER — Encounter: Payer: Self-pay | Admitting: Medical

## 2020-07-04 DIAGNOSIS — Z229 Carrier of infectious disease, unspecified: Secondary | ICD-10-CM | POA: Insufficient documentation

## 2020-07-04 NOTE — Telephone Encounter (Signed)
Called Pt using [employer] Interpreter id# [id number] regarding [employer] results, no answer, left VM for call back.  

## 2020-07-04 NOTE — Telephone Encounter (Signed)
Called Pt using [employer] Interpreter id# [phone] regarding [employer] results, no answer, left VM for call back.

## 2020-07-05 ENCOUNTER — Ambulatory Visit (INDEPENDENT_AMBULATORY_CARE_PROVIDER_SITE_OTHER): Payer: 59

## 2020-07-05 ENCOUNTER — Other Ambulatory Visit: Payer: Self-pay

## 2020-07-05 ENCOUNTER — Other Ambulatory Visit (HOSPITAL_COMMUNITY)
Admission: RE | Admit: 2020-07-05 | Discharge: 2020-07-05 | Disposition: A | Discharge status: Home / Self Care | Payer: 59 | Source: Ambulatory Visit

## 2020-07-05 VITALS — BP 106/68 | HR 85 | Wt 157.5 lb

## 2020-07-05 DIAGNOSIS — Z3A37 37 weeks gestation of pregnancy: Secondary | ICD-10-CM

## 2020-07-05 DIAGNOSIS — Z3490 Encounter for supervision of normal pregnancy, unspecified, unspecified trimester: Secondary | ICD-10-CM

## 2020-07-05 DIAGNOSIS — Z789 Other specified health status: Secondary | ICD-10-CM

## 2020-07-05 NOTE — Patient Instructions (Signed)

## 2020-07-05 NOTE — Progress Notes (Signed)
Assisted Provider with 36 week  Swabs.

## 2020-07-05 NOTE — Progress Notes (Signed)
   LOW-RISK PREGNANCY OFFICE VISIT  Patient name: Colleen Sampson MRN [id number]  Date of birth: [birth year] Chief Complaint:   Routine Prenatal Visit  Subjective:   Colleen Sampson is a 24 y.o. G57P1001 female at [redacted]w[redacted]d with an Estimated Date of Delivery: 07/25/20 being seen today for ongoing management of a low-risk pregnancy aeb has Language barrier; History of oligohydramnios; Encounter for supervision of low-risk pregnancy, antepartum; Late prenatal care affecting pregnancy, antepartum, third trimester; and Carrier of disease on their problem list.  Patient presents today without complaints. Patient endorses fetal movement and denies vaginal concerns including abnormal discharge, leaking of fluid, and bleeding. Patient denies abdominal pain or contractions.  Contractions: Not present. Vag. Bleeding: None.  Movement: Present.  Reviewed past medical,surgical, social, obstetrical and family history as well as problem list, medications and allergies.  Objective   Vitals:   07/05/20 1400  BP: 106/68  Pulse: 85  Weight: 157 lb 8 oz (71.4 kg)  Body mass index is 26.21 kg/m.  Total Weight Gain:Not found.         Physical Examination:   General appearance: Well appearing, and in no distress  Mental status: Alert, oriented to person, place, and time  Skin: Warm & dry  Cardiovascular: Normal heart rate noted  Respiratory: Normal respiratory effort, no distress  Abdomen: Soft, gravid, nontender, AGA with Fundal height of    Pelvic: Cervical exam deferred           Extremities: Edema: None  Fetal Status: Fetal Heart Rate (bpm): 131  Movement: Present   No results found for this or any previous visit (from the past 24 hour(s)).  Assessment & Plan:  Low-risk pregnancy of a 24 y.o., G2P1001 at [redacted]w[redacted]d with an Estimated Date of Delivery: 07/25/20   1. Encounter for supervision of low-risk pregnancy, antepartum -Labs as below. -Educated on GBS bacteria including what it is, why we test, and how and  when we treat if needed. -Discussed and reviewed postpartum planning including contraception, pediatricians, and infant feedings. *Patient desires to breastfeed and is considering Nexplanon for contraception.  2. Language barrier -Arabic speaking. -Interpreter present.   3. [redacted] weeks gestation of pregnancy -GBS/STD testing completed -Declines VE today.  -Discussed s/s of labor. -Informed that if no s/s of labor by 39 weeks will schedule for IOL b/t 40-41 weeks.    Meds: No orders of the defined types were placed in this encounter.  Labs/procedures today:  Lab Orders     Culture, beta strep (group b only)   Reviewed: Term labor symptoms and general obstetric precautions including but not limited to vaginal bleeding, contractions, leaking of fluid and fetal movement were reviewed in detail with the patient.  All questions were answered.  Follow-up: Return in about 1 week (around 07/12/2020) for LROB.  Orders Placed This Encounter  Procedures  . Culture, beta strep (group b only)   Cherre Robins MSN, CNM 07/05/2020

## 2020-07-06 LAB — GC/CHLAMYDIA PROBE AMP (~~LOC~~) NOT AT ARMC
Chlamydia: NEGATIVE
Comment: NEGATIVE
Comment: NORMAL
Neisseria Gonorrhea: NEGATIVE

## 2020-07-08 DIAGNOSIS — B951 Streptococcus, group B, as the cause of diseases classified elsewhere: Secondary | ICD-10-CM | POA: Insufficient documentation

## 2020-07-08 LAB — CULTURE, BETA STREP (GROUP B ONLY): Strep Gp B Culture: POSITIVE — AB

## 2020-07-12 ENCOUNTER — Ambulatory Visit (INDEPENDENT_AMBULATORY_CARE_PROVIDER_SITE_OTHER): Payer: 59 | Admitting: Medical

## 2020-07-12 ENCOUNTER — Other Ambulatory Visit: Payer: Self-pay

## 2020-07-12 VITALS — BP 109/69 | HR 75 | Wt 159.0 lb

## 2020-07-12 DIAGNOSIS — Z789 Other specified health status: Secondary | ICD-10-CM

## 2020-07-12 DIAGNOSIS — Z3A38 38 weeks gestation of pregnancy: Secondary | ICD-10-CM

## 2020-07-12 DIAGNOSIS — Z229 Carrier of infectious disease, unspecified: Secondary | ICD-10-CM | POA: Diagnosis not present

## 2020-07-12 DIAGNOSIS — O99820 Streptococcus B carrier state complicating pregnancy: Secondary | ICD-10-CM

## 2020-07-12 DIAGNOSIS — O26843 Uterine size-date discrepancy, third trimester: Secondary | ICD-10-CM

## 2020-07-12 DIAGNOSIS — B951 Streptococcus, group B, as the cause of diseases classified elsewhere: Secondary | ICD-10-CM

## 2020-07-12 DIAGNOSIS — Z8759 Personal history of other complications of pregnancy, childbirth and the puerperium: Secondary | ICD-10-CM

## 2020-07-12 DIAGNOSIS — Z3490 Encounter for supervision of normal pregnancy, unspecified, unspecified trimester: Secondary | ICD-10-CM

## 2020-07-12 NOTE — Progress Notes (Signed)
   PRENATAL VISIT NOTE  Subjective:  Colleen Sampson is a 24 y.o. G2P1001 at [redacted]w[redacted]d being seen today for ongoing prenatal care.  She is currently monitored for the following issues for this high-risk pregnancy and has Language barrier; Positive GBS test; History of oligohydramnios; Encounter for supervision of low-risk pregnancy, antepartum; Late prenatal care affecting pregnancy, antepartum, third trimester; and Carrier of disease on their problem list.  Patient reports pelvic pressure.  Contractions: Not present. Vag. Bleeding: None.  Movement: Present. Denies leaking of fluid.   The following portions of the patient's history were reviewed and updated as appropriate: allergies, current medications, past family history, past medical history, past social history, past surgical history and problem list.   Objective:   Vitals:   07/12/20 1457  BP: 109/69  Pulse: 75  Weight: 159 lb (72.1 kg)    Fetal Status: Fetal Heart Rate (bpm): 148 Fundal Height: 34 cm Movement: Present     General:  Alert, oriented and cooperative. Patient is in no acute distress.  Skin: Skin is warm and dry. No rash noted.   Cardiovascular: Normal heart rate noted  Respiratory: Normal respiratory effort, no problems with respiration noted  Abdomen: Soft, gravid, appropriate for gestational age.  Pain/Pressure: Present     Pelvic: Cervical exam deferred        Extremities: Normal range of motion.  Edema: None  Mental Status: Normal mood and affect. Normal behavior. Normal judgment and thought content.   Assessment and Plan:  Pregnancy: G2P1001 at [redacted]w[redacted]d 1. Encounter for supervision of low-risk pregnancy, antepartum - Doing well  2. Positive GBS test - Discussed results and need for treatment in labor   3. 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 History of oligohydramnios - Normal AFI at 34 week [place]  5. Language barrier - Husband present to interpreter, has signed waiver  6. [redacted] weeks gestation of  pregnancy  7. Uterine size date discrepancy pregnancy, third trimester - [place] MFM OB FOLLOW UP; scheduled  Term labor symptoms and general obstetric precautions including but not limited to vaginal bleeding, contractions, leaking of fluid and fetal movement were reviewed in detail with the patient. Please refer to After Visit Summary for other counseling recommendations.   Return in about 1 week (around 07/19/2020) for LOB, In-Person.  No future appointments.  Vonzella Nipple, PA-C

## 2020-07-12 NOTE — Patient Instructions (Signed)
Fetal Movement Counts Patient Name: ________________________________________________ Patient Due Date: ____________________ What is a fetal movement count?  A fetal movement count is the number of times that you feel your baby move during a certain amount of time. This may also be called a fetal kick count. A fetal movement count is recommended for every pregnant woman. You may be asked to start counting fetal movements as early as week 28 of your pregnancy. Pay attention to when your baby is most active. You may notice your baby's sleep and wake cycles. You may also notice things that make your baby move more. You should do a fetal movement count:  When your baby is normally most active.  At the same time each day. A good time to count movements is while you are resting, after having something to eat and drink. How do I count fetal movements? 1. Find a quiet, comfortable area. Sit, or lie down on your side. 2. Write down the date, the start time and stop time, and the number of movements that you felt between those two times. Take this information with you to your health care visits. 3. Write down your start time when you feel the first movement. 4. Count kicks, flutters, swishes, rolls, and jabs. You should feel at least 10 movements. 5. You may stop counting after you have felt 10 movements, or if you have been counting for 2 hours. Write down the stop time. 6. If you do not feel 10 movements in 2 hours, contact your health care provider for further instructions. Your health care provider may want to do additional tests to assess your baby's well-being. Contact a health care provider if:  You feel fewer than 10 movements in 2 hours.  Your baby is not moving like he or she usually does. Date: ____________ Start time: ____________ Stop time: ____________ Movements: ____________ Date: ____________ Start time: ____________ Stop time: ____________ Movements: ____________ Date: ____________  Start time: ____________ Stop time: ____________ Movements: ____________ Date: ____________ Start time: ____________ Stop time: ____________ Movements: ____________ Date: ____________ Start time: ____________ Stop time: ____________ Movements: ____________ Date: ____________ Start time: ____________ Stop time: ____________ Movements: ____________ Date: ____________ Start time: ____________ Stop time: ____________ Movements: ____________ Date: ____________ Start time: ____________ Stop time: ____________ Movements: ____________ Date: ____________ Start time: ____________ Stop time: ____________ Movements: ____________ This information is not intended to replace advice given to you by your health care provider. Make sure you discuss any questions you have with your health care provider. Document Revised: 06/16/2019 Document Reviewed: 06/16/2019 Elsevier Patient Education  2020 [employer]        Signs and Symptoms of Labor Labor is your body's natural process of moving your baby, placenta, and umbilical cord out of your uterus. The process of labor usually starts when your baby is full-term, between 37 and 40 weeks of pregnancy. How will I know when I am close to going into labor? As your body prepares for labor and the birth of your baby, you may notice the following symptoms in the weeks and days before true labor starts:  Having a strong desire to get your home ready to receive your new baby. This is called nesting. Nesting may be a sign that labor is approaching, and it may occur several weeks before birth. Nesting may involve cleaning and organizing your home.  Passing a small amount of thick, bloody mucus out of your vagina (normal bloody show or losing your mucus plug). This may happen more than a   week before labor begins, or it might occur right before labor begins as the opening of the cervix starts to widen (dilate). For some women, the entire mucus plug passes at once. For others,  smaller portions of the mucus plug may gradually pass over several days.  Your baby moving (dropping) lower in your pelvis to get into position for birth (lightening). When this happens, you may feel more pressure on your bladder and pelvic bone and less pressure on your ribs. This may make it easier to breathe. It may also cause you to need to urinate more often and have problems with bowel movements.  Having "practice contractions" (Braxton Hicks contractions) that occur at irregular (unevenly spaced) intervals that are more than 10 minutes apart. This is also called false labor. False labor contractions are common after exercise or sexual activity, and they will stop if you change position, rest, or drink fluids. These contractions are usually mild and do not get stronger over time. They may feel like: ? A backache or back pain. ? Mild cramps, similar to menstrual cramps. ? Tightening or pressure in your abdomen. Other early symptoms that labor may be starting soon include:  Nausea or loss of appetite.  Diarrhea.  Having a sudden burst of energy, or feeling very tired.  Mood changes.  Having trouble sleeping. How will I know when labor has begun? Signs that true labor has begun may include:  Having contractions that come at regular (evenly spaced) intervals and increase in intensity. This may feel like more intense tightening or pressure in your abdomen that moves to your back. ? Contractions may also feel like rhythmic pain in your upper thighs or back that comes and goes at regular intervals. ? For first-time mothers, this change in intensity of contractions often occurs at a more gradual pace. ? Women who have given birth before may notice a more rapid progression of contraction changes.  Having a feeling of pressure in the vaginal area.  Your water breaking (rupture of membranes). This is when the sac of fluid that surrounds your baby breaks. When this happens, you will notice  fluid leaking from your vagina. This may be clear or blood-tinged. Labor usually starts within 24 hours of your water breaking, but it may take longer to begin. ? Some women notice this as a gush of fluid. ? Others notice that their underwear repeatedly becomes damp. Follow these instructions at home:   When labor starts, or if your water breaks, call your health care provider or nurse care line. Based on your situation, they will determine when you should go in for an exam.  When you are in early labor, you may be able to rest and manage symptoms at home. Some strategies to try at home include: ? Breathing and relaxation techniques. ? Taking a warm bath or shower. ? Listening to music. ? Using a heating pad on the lower back for pain. If you are directed to use heat:  Place a towel between your skin and the heat source.  Leave the heat on for 20-30 minutes.  Remove the heat if your skin turns bright red. This is especially important if you are unable to feel pain, heat, or cold. You may have a greater risk of getting burned. Get help right away if:  You have painful, regular contractions that are 5 minutes apart or less.  Labor starts before you are [redacted] weeks along in your pregnancy.  You have a fever.  You have   a headache that does not go away.  You have bright red blood coming from your vagina.  You do not feel your baby moving.  You have a sudden onset of: ? Severe headache with vision problems. ? Nausea, vomiting, or diarrhea. ? Chest pain or shortness of breath. These symptoms may be an emergency. If your health care provider recommends that you go to the hospital or birth center where you plan to deliver, do not drive yourself. Have someone else drive you, or call emergency services (911 in the [place] Summary  Labor is your body's natural process of moving your baby, placenta, and umbilical cord out of your uterus.  The process of labor usually starts when your baby is  full-term, between 37 and 40 weeks of pregnancy.  When labor starts, or if your water breaks, call your health care provider or nurse care line. Based on your situation, they will determine when you should go in for an exam. This information is not intended to replace advice given to you by your health care provider. Make sure you discuss any questions you have with your health care provider. Document Revised: 07/27/2017 Document Reviewed: 04/03/2017 Elsevier Patient Education  2020 [employer] Inc.  

## 2020-07-19 ENCOUNTER — Encounter: Payer: 59 | Admitting: Nurse Practitioner

## 2020-07-23 ENCOUNTER — Other Ambulatory Visit: Payer: Self-pay

## 2020-07-23 ENCOUNTER — Encounter: Payer: Self-pay | Admitting: *Deleted

## 2020-07-23 ENCOUNTER — Ambulatory Visit (HOSPITAL_BASED_OUTPATIENT_CLINIC_OR_DEPARTMENT_OTHER): Payer: 59

## 2020-07-23 ENCOUNTER — Ambulatory Visit: Payer: 59 | Admitting: *Deleted

## 2020-07-23 DIAGNOSIS — Z3A39 39 weeks gestation of pregnancy: Secondary | ICD-10-CM

## 2020-07-23 DIAGNOSIS — Z3490 Encounter for supervision of normal pregnancy, unspecified, unspecified trimester: Secondary | ICD-10-CM

## 2020-07-23 DIAGNOSIS — Z229 Carrier of infectious disease, unspecified: Secondary | ICD-10-CM | POA: Insufficient documentation

## 2020-07-23 DIAGNOSIS — Z363 Encounter for antenatal screening for malformations: Secondary | ICD-10-CM | POA: Diagnosis not present

## 2020-07-23 DIAGNOSIS — O26843 Uterine size-date discrepancy, third trimester: Secondary | ICD-10-CM | POA: Insufficient documentation

## 2020-07-23 DIAGNOSIS — O0933 Supervision of pregnancy with insufficient antenatal care, third trimester: Secondary | ICD-10-CM | POA: Diagnosis not present

## 2020-07-24 ENCOUNTER — Encounter (HOSPITAL_COMMUNITY): Payer: Self-pay | Admitting: Obstetrics and Gynecology

## 2020-07-24 ENCOUNTER — Inpatient Hospital Stay (HOSPITAL_COMMUNITY)
Admission: AD | Admit: 2020-07-24 | Discharge: 2020-07-24 | Disposition: A | Discharge status: Home / Self Care | Payer: 59 | Source: Home / Self Care | Attending: Obstetrics and Gynecology | Admitting: Obstetrics and Gynecology

## 2020-07-24 DIAGNOSIS — Z3490 Encounter for supervision of normal pregnancy, unspecified, unspecified trimester: Secondary | ICD-10-CM

## 2020-07-24 DIAGNOSIS — O471 False labor at or after 37 completed weeks of gestation: Secondary | ICD-10-CM | POA: Insufficient documentation

## 2020-07-24 DIAGNOSIS — Z229 Carrier of infectious disease, unspecified: Secondary | ICD-10-CM

## 2020-07-24 DIAGNOSIS — Z3A39 39 weeks gestation of pregnancy: Secondary | ICD-10-CM | POA: Insufficient documentation

## 2020-07-24 NOTE — Discharge Instructions (Signed)
Braxton Hicks Contractions °Contractions of the uterus can occur throughout pregnancy, but they are not always a sign that you are in labor. You may have practice contractions called Braxton Hicks contractions. These false labor contractions are sometimes confused with true labor. °What are Braxton Hicks contractions? °Braxton Hicks contractions are tightening movements that occur in the muscles of the uterus before labor. Unlike true labor contractions, these contractions do not result in opening (dilation) and thinning of the cervix. Toward the end of pregnancy (32-34 weeks), Braxton Hicks contractions can happen more often and may become stronger. These contractions are sometimes difficult to tell apart from true labor because they can be very uncomfortable. You should not feel embarrassed if you go to the hospital with false labor. °Sometimes, the only way to tell if you are in true labor is for your health care provider to look for changes in the cervix. The health care provider will do a physical exam and may monitor your contractions. If you are not in true labor, the exam should show that your cervix is not dilating and your water has not broken. °If there are no other health problems associated with your pregnancy, it is completely safe for you to be sent home with false labor. You may continue to have Braxton Hicks contractions until you go into true labor. °How to tell the difference between true labor and false labor °True labor °· Contractions last 30-70 seconds. °· Contractions become very regular. °· Discomfort is usually felt in the top of the uterus, and it spreads to the lower abdomen and low back. °· Contractions do not go away with walking. °· Contractions usually become more intense and increase in frequency. °· The cervix dilates and gets thinner. °False labor °· Contractions are usually shorter and not as strong as true labor contractions. °· Contractions are usually irregular. °· Contractions  are often felt in the front of the lower abdomen and in the groin. °· Contractions may go away when you walk around or change positions while lying down. °· Contractions get weaker and are shorter-lasting as time goes on. °· The cervix usually does not dilate or become thin. °Follow these instructions at home: ° °· Take over-the-counter and prescription medicines only as told by your health care provider. °· Keep up with your usual exercises and follow other instructions from your health care provider. °· Eat and drink lightly if you think you are going into labor. °· If Braxton Hicks contractions are making you uncomfortable: °? Change your position from lying down or resting to walking, or change from walking to resting. °? Sit and rest in a tub of warm water. °? Drink enough fluid to keep your urine pale yellow. Dehydration may cause these contractions. °? Do slow and deep breathing several times an hour. °· Keep all follow-up prenatal visits as told by your health care provider. This is important. °Contact a health care provider if: °· You have a fever. °· You have continuous pain in your abdomen. °Get help right away if: °· Your contractions become stronger, more regular, and closer together. °· You have fluid leaking or gushing from your vagina. °· You pass blood-tinged mucus (bloody show). °· You have bleeding from your vagina. °· You have low back pain that you never had before. °· You feel your baby’s head pushing down and causing pelvic pressure. °· Your baby is not moving inside you as much as it used to. °Summary °· Contractions that occur before labor are   called Braxton Hicks contractions, false labor, or practice contractions. °· Braxton Hicks contractions are usually shorter, weaker, farther apart, and less regular than true labor contractions. True labor contractions usually become progressively stronger and regular, and they become more frequent. °· Manage discomfort from Braxton Hicks contractions  by changing position, resting in a warm bath, drinking plenty of water, or practicing deep breathing. °This information is not intended to replace advice given to you by your health care provider. Make sure you discuss any questions you have with your health care provider. °Document Revised: 10/09/2017 Document Reviewed: 03/12/2017 °Elsevier Patient Education © 2020 [employer] ° °

## 2020-07-24 NOTE — MAU Provider Note (Signed)
None      S: Ms. Colleen Sampson is a 24 y.o. G2P1001 at [redacted]w[redacted]d  who presents to MAU today complaining contractions once an hour since last night then to every 10 minutes since 2200. She denies vaginal bleeding. She denies LOF. She reports normal fetal movement.    O: BP 115/73    Pulse 78    Temp 97.6 F (36.4 C) (Oral)    Resp 16    LMP 10/19/2019 (Exact Date)    SpO2 100% Comment: room air GENERAL: Well-developed, well-nourished female in no acute distress.  HEAD: Normocephalic, atraumatic.  CHEST: Normal effort of breathing, regular heart rate ABDOMEN: Soft, nontender, gravid  Cervical exam:  Dilation: Closed Exam by:: Jahmez Bily cnm   Fetal Monitoring: Baseline: 135 Variability: mod Accelerations: present Decelerations: absenet Contractions: uterine irratability   A: SIUP at [redacted]w[redacted]d  False labor  P: Discharge home, patient declines further monitoring or evaluation.  Keep appt on Thursday; return to MAU if symptoms worsen or change.   Marylene Land, CNM 07/24/2020 8:40 AM    Charlesetta Garibaldi Hershel Corkery 07/24/2020, 8:40 AM

## 2020-07-24 NOTE — MAU Note (Signed)
Colleen Sampson is a 24 y.o. at [redacted]w[redacted]d here in MAU reporting: contractions started last night around 2200 and she was having them once an hour until 0200 and then they started coming every 10 minutes. No bleeding, no LOF. +FM.  Onset of complaint: last night  Pain score: 6/10  Vitals:   07/24/20 0708  BP: 113/71  Pulse: 74  Resp: 16  Temp: 97.6 F (36.4 C)  SpO2: 100%     FHT: +FM EFM applied in room  Lab orders placed from triage: none

## 2020-07-25 ENCOUNTER — Encounter (HOSPITAL_COMMUNITY): Payer: Self-pay | Admitting: Obstetrics & Gynecology

## 2020-07-25 ENCOUNTER — Inpatient Hospital Stay (HOSPITAL_COMMUNITY): Payer: 59 | Admitting: Anesthesiology

## 2020-07-25 ENCOUNTER — Inpatient Hospital Stay (HOSPITAL_COMMUNITY)
Admission: AD | Admit: 2020-07-25 | Discharge: 2020-07-27 | DRG: 807 | Disposition: A | Discharge status: Home / Self Care | Payer: 59 | Attending: Obstetrics and Gynecology | Admitting: Obstetrics and Gynecology

## 2020-07-25 ENCOUNTER — Other Ambulatory Visit: Payer: Self-pay

## 2020-07-25 DIAGNOSIS — O99824 Streptococcus B carrier state complicating childbirth: Secondary | ICD-10-CM | POA: Diagnosis present

## 2020-07-25 DIAGNOSIS — Z20822 Contact with and (suspected) exposure to covid-19: Secondary | ICD-10-CM | POA: Diagnosis present

## 2020-07-25 DIAGNOSIS — Z8759 Personal history of other complications of pregnancy, childbirth and the puerperium: Secondary | ICD-10-CM

## 2020-07-25 DIAGNOSIS — Z229 Carrier of infectious disease, unspecified: Secondary | ICD-10-CM

## 2020-07-25 DIAGNOSIS — O0933 Supervision of pregnancy with insufficient antenatal care, third trimester: Secondary | ICD-10-CM

## 2020-07-25 DIAGNOSIS — Z789 Other specified health status: Secondary | ICD-10-CM | POA: Diagnosis present

## 2020-07-25 DIAGNOSIS — O26893 Other specified pregnancy related conditions, third trimester: Secondary | ICD-10-CM | POA: Diagnosis present

## 2020-07-25 DIAGNOSIS — Z3A40 40 weeks gestation of pregnancy: Secondary | ICD-10-CM

## 2020-07-25 DIAGNOSIS — Z3490 Encounter for supervision of normal pregnancy, unspecified, unspecified trimester: Secondary | ICD-10-CM

## 2020-07-25 DIAGNOSIS — B951 Streptococcus, group B, as the cause of diseases classified elsewhere: Secondary | ICD-10-CM | POA: Diagnosis present

## 2020-07-25 LAB — CBC
HCT: 36 % (ref 36.0–46.0)
Hemoglobin: 12 g/dL (ref 12.0–15.0)
MCH: 28.2 pg (ref 26.0–34.0)
MCHC: 33.3 g/dL (ref 30.0–36.0)
MCV: 84.5 fL (ref 80.0–100.0)
Platelets: 220 10*3/uL (ref 150–400)
RBC: 4.26 MIL/uL (ref 3.87–5.11)
RDW: 13.6 % (ref 11.5–15.5)
WBC: 6.4 10*3/uL (ref 4.0–10.5)
nRBC: 0 % (ref 0.0–0.2)

## 2020-07-25 LAB — TYPE AND SCREEN
ABO/RH(D): O POS
Antibody Screen: NEGATIVE

## 2020-07-25 LAB — SYPHILIS: RPR W/REFLEX TO RPR TITER AND TREPONEMAL ANTIBODIES, TRADITIONAL SCREENING AND DIAGNOSIS ALGORITHM: RPR Ser Ql: NONREACTIVE

## 2020-07-25 LAB — SARS CORONAVIRUS 2 BY RT PCR (HOSPITAL ORDER, PERFORMED IN ~~LOC~~ HOSPITAL LAB): SARS Coronavirus 2: NEGATIVE

## 2020-07-25 MED ORDER — LIDOCAINE HCL (PF) 1 % IJ SOLN
INTRAMUSCULAR | Status: DC | PRN
  Administered 2020-07-25 (×2): 4 mL via EPIDURAL

## 2020-07-25 MED ORDER — SENNOSIDES-DOCUSATE SODIUM 8.6-50 MG PO TABS
2.0000 | ORAL_TABLET | ORAL | Status: DC
  Administered 2020-07-25 – 2020-07-26 (×2): 2 via ORAL
  Filled 2020-07-25 (×2): qty 2

## 2020-07-25 MED ORDER — LACTATED RINGERS IV SOLN: 500.0000 mL | Freq: Once | INTRAVENOUS | Status: DC

## 2020-07-25 MED ORDER — PHENYLEPHRINE 40 MCG/ML (10ML) SYRINGE FOR IV PUSH (FOR BLOOD PRESSURE SUPPORT)
80.0000 ug | PREFILLED_SYRINGE | INTRAVENOUS | Status: DC | PRN
  Filled 2020-07-25: qty 10

## 2020-07-25 MED ORDER — SIMETHICONE 80 MG PO CHEW: 80.0000 mg | CHEWABLE_TABLET | ORAL | Status: DC | PRN

## 2020-07-25 MED ORDER — LIDOCAINE HCL (PF) 1 % IJ SOLN
30.0000 mL | INTRAMUSCULAR | Status: AC | PRN
  Administered 2020-07-25: 30 mL via SUBCUTANEOUS
  Filled 2020-07-25: qty 30

## 2020-07-25 MED ORDER — ONDANSETRON HCL 4 MG/2ML IJ SOLN: 4.0000 mg | INTRAMUSCULAR | Status: DC | PRN

## 2020-07-25 MED ORDER — OXYCODONE-ACETAMINOPHEN 5-325 MG PO TABS: 1.0000 | ORAL_TABLET | ORAL | Status: DC | PRN

## 2020-07-25 MED ORDER — WITCH HAZEL-GLYCERIN EX PADS: 1.0000 "application " | MEDICATED_PAD | CUTANEOUS | Status: DC | PRN

## 2020-07-25 MED ORDER — PRENATAL MULTIVITAMIN CH
1.0000 | ORAL_TABLET | Freq: Every day | ORAL | Status: DC
  Administered 2020-07-25 – 2020-07-27 (×3): 1 via ORAL
  Filled 2020-07-25 (×3): qty 1

## 2020-07-25 MED ORDER — DIBUCAINE (PERIANAL) 1 % EX OINT: 1.0000 "application " | TOPICAL_OINTMENT | CUTANEOUS | Status: DC | PRN

## 2020-07-25 MED ORDER — LIDOCAINE HCL 1 % IJ SOLN: 0.0000 mL | Freq: Once | INTRAMUSCULAR | Status: DC | PRN

## 2020-07-25 MED ORDER — EPHEDRINE 5 MG/ML INJ: 10.0000 mg | INTRAVENOUS | Status: DC | PRN

## 2020-07-25 MED ORDER — PENICILLIN G POT IN DEXTROSE 60000 UNIT/ML IV SOLN: 3.0000 10*6.[IU] | INTRAVENOUS | Status: DC

## 2020-07-25 MED ORDER — LACTATED RINGERS IV SOLN: INTRAVENOUS | Status: DC

## 2020-07-25 MED ORDER — DIPHENHYDRAMINE HCL 50 MG/ML IJ SOLN: 12.5000 mg | INTRAMUSCULAR | Status: DC | PRN

## 2020-07-25 MED ORDER — OXYCODONE-ACETAMINOPHEN 5-325 MG PO TABS: 2.0000 | ORAL_TABLET | ORAL | Status: DC | PRN

## 2020-07-25 MED ORDER — PHENYLEPHRINE 40 MCG/ML (10ML) SYRINGE FOR IV PUSH (FOR BLOOD PRESSURE SUPPORT)
80.0000 ug | PREFILLED_SYRINGE | INTRAVENOUS | Status: DC | PRN
  Administered 2020-07-25: 80 ug via INTRAVENOUS

## 2020-07-25 MED ORDER — OXYTOCIN-SODIUM CHLORIDE 30-0.9 UT/500ML-% IV SOLN
2.5000 [IU]/h | INTRAVENOUS | Status: DC
  Filled 2020-07-25: qty 500

## 2020-07-25 MED ORDER — FENTANYL CITRATE (PF) 100 MCG/2ML IJ SOLN
50.0000 ug | INTRAMUSCULAR | Status: DC | PRN
  Administered 2020-07-25: 100 ug via INTRAVENOUS
  Filled 2020-07-25: qty 2

## 2020-07-25 MED ORDER — IBUPROFEN 600 MG PO TABS
600.0000 mg | ORAL_TABLET | Freq: Four times a day (QID) | ORAL | Status: DC
  Administered 2020-07-25 – 2020-07-27 (×8): 600 mg via ORAL
  Filled 2020-07-25 (×9): qty 1

## 2020-07-25 MED ORDER — OXYTOCIN BOLUS FROM INFUSION
333.0000 mL | Freq: Once | INTRAVENOUS | Status: AC
  Administered 2020-07-25: 333 mL via INTRAVENOUS

## 2020-07-25 MED ORDER — SODIUM CHLORIDE (PF) 0.9 % IJ SOLN
INTRAMUSCULAR | Status: DC | PRN
  Administered 2020-07-25: 12 mL/h via EPIDURAL

## 2020-07-25 MED ORDER — COCONUT OIL OIL
1.0000 "application " | TOPICAL_OIL | Status: DC | PRN
  Administered 2020-07-27: 1 via TOPICAL

## 2020-07-25 MED ORDER — ONDANSETRON HCL 4 MG/2ML IJ SOLN: 4.0000 mg | Freq: Four times a day (QID) | INTRAMUSCULAR | Status: DC | PRN

## 2020-07-25 MED ORDER — DIPHENHYDRAMINE HCL 25 MG PO CAPS: 25.0000 mg | ORAL_CAPSULE | Freq: Four times a day (QID) | ORAL | Status: DC | PRN

## 2020-07-25 MED ORDER — SOD CITRATE-CITRIC ACID 500-334 MG/5ML PO SOLN: 30.0000 mL | ORAL | Status: DC | PRN

## 2020-07-25 MED ORDER — FENTANYL-BUPIVACAINE-NACL 0.5-0.125-0.9 MG/250ML-% EP SOLN
12.0000 mL/h | EPIDURAL | Status: DC | PRN
  Filled 2020-07-25: qty 250

## 2020-07-25 MED ORDER — SODIUM CHLORIDE 0.9 % IV SOLN
5.0000 10*6.[IU] | Freq: Once | INTRAVENOUS | Status: AC
  Administered 2020-07-25: 5 10*6.[IU] via INTRAVENOUS
  Filled 2020-07-25: qty 5

## 2020-07-25 MED ORDER — ACETAMINOPHEN 325 MG PO TABS: 650.0000 mg | ORAL_TABLET | ORAL | Status: DC | PRN

## 2020-07-25 MED ORDER — BENZOCAINE-MENTHOL 20-0.5 % EX AERO
1.0000 "application " | INHALATION_SPRAY | CUTANEOUS | Status: DC | PRN
  Administered 2020-07-25: 1 via TOPICAL
  Filled 2020-07-25: qty 56

## 2020-07-25 MED ORDER — ETONOGESTREL 68 MG ~~LOC~~ IMPL: 68.0000 mg | DRUG_IMPLANT | Freq: Once | SUBCUTANEOUS | Status: DC

## 2020-07-25 MED ORDER — ONDANSETRON HCL 4 MG PO TABS: 4.0000 mg | ORAL_TABLET | ORAL | Status: DC | PRN

## 2020-07-25 MED ORDER — LACTATED RINGERS IV SOLN: 500.0000 mL | INTRAVENOUS | Status: DC | PRN

## 2020-07-25 NOTE — H&P (Addendum)
OBSTETRIC ADMISSION HISTORY AND PHYSICAL  Colleen Sampson is a 24 y.o. female G2P1001 with IUP at [redacted]w[redacted]d by LMP presenting for SOL. She reports +FMs, No LOF, no VB, no blurry vision, headaches or peripheral edema, and RUQ pain.  She plans on breast feeding. She request nexplanon for birth control. She received her prenatal care at [facility]   Dating: By LMP --->  Estimated Date of Delivery: 07/25/20  Sono:    07/23/20@[redacted]w[redacted]d , CWD, normal anatomy, cephalic presentation, 3223g, 16% EFW   Prenatal History/Complications:  GBS positive Late PNC (IPV @[redacted]w[redacted]d ) SMA carrier Language barrier (Arabic-husband signed interpreter waiver) History of oligohydramnios in prior pregnancy (normal AFI this preg)  Past Medical History: Past Medical History:  Diagnosis Date  . Medical history non-contributory     Past Surgical History: Past Surgical History:  Procedure Laterality Date  . NO PAST SURGERIES      Obstetrical History: OB History    Gravida  2   Para  1   Term  1   Preterm  0   AB  0   Living  1     SAB  0   TAB  0   Ectopic  0   Multiple  0   Live Births  1           Social History Social History   Socioeconomic History  . Marital status: Married    Spouse name: Not on file  . Number of children: Not on file  . Years of education: Not on file  . Highest education level: Not on file  Occupational History  . Not on file  Tobacco Use  . Smoking status: Never Smoker  . Smokeless tobacco: Never Used  Vaping Use  . Vaping Use: Never used  Substance and Sexual Activity  . Alcohol use: No  . Drug use: No  . Sexual activity: Yes    Birth control/protection: None  Other Topics Concern  . Not on file  Social History Narrative  . Not on file   Social Determinants of Health   Financial Resource Strain:   . Difficulty of Paying Living Expenses: Not on file  Food Insecurity: No Food Insecurity  . Worried About in the Last Year: Never true  .  Ran Out of Food in the Last Year: Never true  Transportation Needs: No Transportation Needs  . Lack of Transportation (Medical): No  . Lack of Transportation (Non-Medical): No  Physical Activity:   . Days of Exercise per Week: Not on file  . Minutes of Exercise per Session: Not on file  Stress:   . Feeling of Stress : Not on file  Social Connections:   . Frequency of Communication with Friends and Family: Not on file  . Frequency of Social Gatherings with Friends and Family: Not on file  . Attends Religious Services: Not on file  . Active Member of Clubs or Organizations: Not on file  . Attends Programme researcher, broadcasting/film/video Meetings: Not on file  . Marital Status: Not on file    Family History: Family History  Problem Relation Age of Onset  . Hypertension Mother   . Hypertension Father     Allergies: No Known Allergies  Medications Prior to Admission  Medication Sig Dispense Refill Last Dose  . Prenatal Vit-Fe Fumarate-FA (PRENATAL VITAMIN) 27-0.8 MG TABS Take 1 tablet by mouth daily. 30 tablet 1      Review of Systems   All systems reviewed and negative except  as stated in HPI  Blood pressure 123/70, pulse 86, temperature 98.7 F (37.1 C), temperature source Oral, resp. rate 19, last menstrual period 10/19/2019, unknown if currently breastfeeding. General appearance: alert, cooperative and no distress Lungs: normal respiratory effort Heart: regular rate and rhythm Abdomen: soft, non-tender; gravid Pelvic: as noted below Extremities: Homans sign is negative, no sign of DVT Presentation: cephalic per MAU RN Fetal monitoringBaseline: 150 bpm, Variability: Good {> 6 bpm), Accelerations: Reactive and Decelerations: Absent Uterine activityFrequency: Every 1-5 minutes Dilation: 4 Effacement (%): 90 Station: -2 Exam by:: Nancee Liter, RN   Prenatal labs: ABO, Rh: O/Positive/-- (08/02 1455) Antibody: Negative (08/02 1455) Rubella: 18.40 (08/02 1455) RPR: Non Reactive  (08/02 1455)  HBsAg: Negative (08/02 1455)  HIV: Non Reactive (08/02 1455)  GBS: Positive/-- (08/26 1419)  1 hr Glucola passed Genetic screening SMA carrier Anatomy US normal, limited by body habitus, follow up normal  Prenatal Transfer Tool  Maternal Diabetes: No Genetic Screening: Abnormal:  Results: Other:SMA carrier Maternal Ultrasounds/Referrals: Normal Fetal Ultrasounds or other Referrals:  None Maternal Substance Abuse:  No Significant Maternal Medications:  None Significant Maternal Lab Results: Group B Strep positive  No results found for this or any previous visit (from the past 24 hour(s)).  Patient Active Problem List   Diagnosis Date Noted  . Positive GBS test 07/08/2020  . Carrier of disease 07/04/2020  . Late prenatal care affecting pregnancy, antepartum, third trimester 06/11/2020  . Encounter for supervision of low-risk pregnancy, antepartum 03/27/2020  . Language barrier 06/09/2018    Assessment/Plan:  Colleen Sampson is a 24 y.o. G2P1001 at [redacted]w[redacted]d here for SOL.   #Labor: Continue expectant management. Making rapid cervical change. #Pain: PRN #FWB: Cat 1 #ID: GBS positive, pt already received PCN prior to rapid cervical change #MOF: breast #MOC: nexplanon #Circ: yes #Late PNC: SW consult pp  Alric Seton, MD  07/25/2020, 4:05 AM

## 2020-07-25 NOTE — Anesthesia Preprocedure Evaluation (Signed)
Anesthesia Evaluation  Patient identified by MRN, date of birth, ID band Patient awake    Reviewed: Allergy & Precautions, H&P , Patient's Chart, lab work & pertinent test results  History of Anesthesia Complications Negative for: history of anesthetic complications  Airway Mallampati: II  TM Distance: >3 FB Neck ROM: full    Dental no notable dental hx.    Pulmonary neg pulmonary ROS,    Pulmonary exam normal breath sounds clear to auscultation       Cardiovascular negative cardio ROS Normal cardiovascular exam Rhythm:regular Rate:Normal     Neuro/Psych negative neurological ROS  negative psych ROS   GI/Hepatic negative GI ROS, Neg liver ROS,   Endo/Other  negative endocrine ROS  Renal/GU negative Renal ROS     Musculoskeletal   Abdominal   Peds  Hematology negative hematology ROS (+)   Anesthesia Other Findings   Reproductive/Obstetrics (+) Pregnancy                             Anesthesia Physical  Anesthesia Plan  ASA: II  Anesthesia Plan: Epidural   Post-op Pain Management:    Induction:   PONV Risk Score and Plan:   Airway Management Planned:   Additional Equipment:   Intra-op Plan:   Post-operative Plan:   Informed Consent: I have reviewed the patients History and Physical, chart, labs and discussed the procedure including the risks, benefits and alternatives for the proposed anesthesia with the patient or authorized representative who has indicated his/her understanding and acceptance.       Plan Discussed with:   Anesthesia Plan Comments:         Anesthesia Quick Evaluation

## 2020-07-25 NOTE — Discharge Summary (Signed)
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 yo G2P1001 at 464w0das admitted in Active Labor on 07/25/2020. Patient had an uncomplicated labor course as follows:  Membrane Rupture Time/Date: 7:21 AM ,07/25/2020   Delivery Method:Vaginal, Spontaneous  Episiotomy: None  Lacerations:  Periurethral;Sulcus  Patient had an uncomplicated postpartum course.  She is ambulating, tolerating a regular diet, passing flatus, and urinating well. Patient is discharged home in stable condition on 07/27/20.  Newborn Data: Birth date:07/25/2020  Birth time:7:37 AM  Gender:Female  Living status:Living  Apgars:9 ,9  Weight:2810 g   Magnesium Sulfate received: No BMZ received: No Rhophylac:N/A MMR:N/A T-DaP:Given prenatally Flu: N/A Transfusion:No  Physical exam  Vitals:   07/26/20 0503 07/26/20 1413 07/26/20 2149 07/27/20 0559  BP: 96/60 1_0  Pulse: 81 86 76 62  Resp: _1 Temp: 98.5 F (36.9 C) 98.5 F (36.9 C) 98.6 F (37 C) 98.2 F (36.8 C)  TempSrc: Oral Axillary Oral   SpO2: 100% 100%  99%  Weight:       Height:       General: alert, cooperative and no distress Lochia: appropriate Uterine Fundus: firm Incision: N/A DVT Evaluation: No evidence of DVT seen on physical exam. No cords or calf tenderness. No significant calf/ankle edema. Labs: Lab Results  Component Value Date   WBC 6.4 07/25/2020   HGB 12.0 07/25/2020   HCT 36.0 07/25/2020   MCV 84.5 07/25/2020   PLT 220 07/25/2020   No flowsheet data found. Edinburgh Score: Edinburgh Postnatal Depression Scale Screening Tool 07/25/2020  I have been able to laugh and see the funny side of things. 0  I have looked forward with enjoyment to things. 0  I have blamed myself unnecessarily when things went wrong. 0  I have been anxious or worried for no good reason. 2  I have felt scared or panicky for no good reason. 1  Things have been getting on top of me. 1  I have been so unhappy that I have had difficulty sleeping. 0  I have felt sad or miserable. 0  I have been so unhappy that I have been crying. 0  The thought of harming myself has occurred to me. 0  Edinburgh Postnatal Depression Scale Total 4     After visit meds:  Allergies as of 07/27/2020   No Known Allergies     Medication List    TAKE these medications   acetaminophen 325  MG tablet Commonly known as: Tylenol Take 2 tablets (650 mg total) by mouth every 6 (six) hours.   coconut oil Oil Apply 1 application topically as needed (nipple pain).   ibuprofen 600 MG tablet Commonly known as: ADVIL Take 1 tablet (600 mg total) by mouth every 8 (eight) hours as needed for moderate pain or cramping.   Prenatal Vitamin 27-0.8 MG Tabs Take 1 tablet by mouth daily.        Discharge home in stable condition Infant Feeding: Breast Infant Disposition:home with mother Discharge instruction: per After Visit Summary and Postpartum booklet. Activity: Advance as tolerated. Pelvic rest for 6 weeks.  Diet: routine diet Future Appointments: Future Appointments   Date Time Provider [place]  08/24/2020  8:15 AM Luvenia Redden, PA-C [facility]   Follow up Visit:   Please schedule this patient for a In person postpartum visit in 4 weeks with the following provider: Any provider. Additional Postpartum F/U:none  Low risk pregnancy complicated by: n/a Delivery mode:  Vaginal, Spontaneous  Anticipated Birth Control: desires outpatient nexplanon  07/27/2020 Janet Berlin, MD

## 2020-07-25 NOTE — Discharge Instructions (Signed)

## 2020-07-25 NOTE — Anesthesia Procedure Notes (Signed)
Epidural Patient location during procedure: OB Start time: 07/25/2020 5:15 AM End time: 07/25/2020 5:30 AM  Staffing Anesthesiologist: Lewie Loron, MD Performed: anesthesiologist   Preanesthetic Checklist Completed: patient identified, IV checked, risks and benefits discussed, monitors and equipment checked, pre-op evaluation and timeout performed  Epidural Patient position: sitting Prep: DuraPrep and site prepped and draped Patient monitoring: heart rate, continuous pulse ox and blood pressure Approach: midline Location: L3-L4 Injection technique: LOR air and LOR saline  Needle:  Needle type: Tuohy  Needle gauge: 17 G Needle length: 9 cm Needle insertion depth: 5 cm Catheter type: closed end flexible Catheter size: 19 Gauge Catheter at skin depth: 10 cm Test dose: negative  Assessment Sensory level: T8 Events: blood not aspirated, injection not painful, no injection resistance, no paresthesia and negative IV test  Additional Notes Reason for block:procedure for pain

## 2020-07-25 NOTE — Anesthesia Postprocedure Evaluation (Signed)
Anesthesia Post Note  Patient: Surveyor, mining  Procedure(s) Performed: AN AD HOC LABOR EPIDURAL     Patient location during evaluation: Mother Baby Anesthesia Type: Epidural Level of consciousness: awake and alert Pain management: pain level controlled Vital Signs Assessment: post-procedure vital signs reviewed and stable Respiratory status: spontaneous breathing, nonlabored ventilation and respiratory function stable Cardiovascular status: stable Postop Assessment: no headache, no backache, epidural receding, no apparent nausea or vomiting, patient able to bend at knees, adequate PO intake and able to ambulate Anesthetic complications: no   No complications documented.  Last Vitals:  Vitals:   07/25/20 0933 07/25/20 1050  BP: 109/72 96/65  Pulse: 72 82  Resp: 18 18  Temp: 36.6 C 36.6 C  SpO2:  100%    Last Pain:  Vitals:   07/25/20 1210  TempSrc:   PainSc: 0-No pain   Pain Goal:                   [place] [employer]

## 2020-07-26 ENCOUNTER — Encounter: Payer: 59 | Admitting: Obstetrics and Gynecology

## 2020-07-26 NOTE — Progress Notes (Addendum)
Patient ID: Colleen Sampson, female   DOB: September 29, 1996, 24 y.o.   MRN: [id number]  POSTPARTUM PROGRESS NOTE  Post Partum Day 1  Subjective:  Colleen Sampson is a 24 y.o. [id number] s/p SVD at [redacted]w[redacted]d. No acute events overnight. Pt denies problems with ambulating, voiding or po intake. She denies nausea or vomiting. Pain is well controlled. She has had flatus. She has not had bowel movement. Lochia Minimal.   Objective: Blood pressure 96/60, pulse 81, temperature 98.5 F (36.9 C), temperature source Oral, resp. rate 18, height 5\' 7"  (1.702 m), weight 71.7 kg, last menstrual period 10/19/2019, SpO2 100 %, unknown if currently breastfeeding.  Physical Exam:  General: alert, cooperative and no distress Chest: no respiratory distress Heart: regular rate, no murmurs, distal pulses intact Abdomen: soft, nontender Uterine Fundus: firm, nontender to palpation DVT Evaluation: No calf swelling or tenderness Extremities: no LE edema Skin: warm, dry  Recent Labs    07/25/20 0414  HGB 12.0  HCT 36.0   Assessment/Plan: Colleen Sampson is a 24 y.o. 30 s/p SVD at [redacted]w[redacted]d   PPD# - Doing well Contraception: Nexplanon- outpt d/t insurance Circ: outpatient Feeding: breast Dispo: Plan for discharge tomorrow d/t inadequate GBS tx   LOS: 1 day   [redacted]w[redacted]d, Medical Student 07/26/2020, 7:51 AM   Midwife attestation I have seen and examined this patient and agree with above documentation in the student's note.   Post Partum Day 1  Colleen Sampson is a 24 y.o. 30 s/p SVD.  Pt denies problems with ambulating, voiding or po intake. Pain is well controlled. Method of Feeding: breast  PE:  Gen: well appearing Heart: reg rate Lungs: normal WOB Fundus firm Ext: soft, no pain, no edema  Assessment: S/p SVD PPD #1  Plan for discharge: tomorrow  [id number], CNM 2:55 PM

## 2020-07-26 NOTE — Clinical Social Work Maternal (Signed)
CLINICAL SOCIAL WORK MATERNAL/CHILD NOTE  Patient Details  Name: Colleen Sampson MRN: [id number] Date of Birth: [birth year]  Date:  07/26/2020  Clinical Social Worker Initiating Note:  Darra Lis, [place] Date/Time: Initiated:  07/26/20/0950     Child's Name:  Colleen Sampson   Biological Parents:  Mother, Father   Need for Interpreter:  Arabic   Reason for Referral:  Late or No Prenatal Care    Address:  [place] [address]    Phone number:  [phone] (home)     Additional phone number:   Household Members/Support Persons (HM/SP):   Household Member/Support Person 1, Household Member/Support Person 2   HM/SP Name Relationship DOB or Age  HM/SP -[name] Son 11/03/2015  HM/SP -2   Adult Uncle    HM/SP -3        HM/SP -4        HM/SP -5        HM/SP -6        HM/SP -7        HM/SP -8          Natural Supports (not living in the home):  Extended Family   Professional Supports:     Employment: Unemployed   Type of Work:     Education:  Public librarian arranged:    Museum/gallery curator Resources:  Multimedia programmer   Other Resources:      Cultural/Religious Considerations Which May Impact Care:    Strengths:  Ability to meet basic needs , Engineer, materials, Home prepared for child    Psychotropic Medications:         Pediatrician:    Solicitor area  Pediatrician List:   [place]      Pediatrician Fax Number:    Risk Factors/Current Problems:  None   Cognitive State:  Alert    Mood/Affect:  Comfortable , Interested , Happy , Calm    CSW Assessment: CSW received consult for late and limited PNC.  CSW met with MOB to complete assessment and offer support. CSW used [employer] services ([facility]ya [phone]) to complete assessment. The sister, [name] of mother was present in the room. MOB declined having her leave for  privacy reasons. CSW observed baby in bed with mother, appearing to be feeding. CSW informed MOB the reason for consult, MOB expressed understanding. CSW asked MOB the reasoning for late prenatal care. [place]OB stated [place] from [place] until July. She expressed prior to that she did not know she was pregnant. CSW asked MOB if there were any other reasons for late prenatal care, MOB stated no. CSW went over the hospital drug screen policy, infomred MOB that an UDS and CDS is being completed on baby. MOB expressed understanding and declined having any questions. CSW informed MOB that the UDS screen came back negative, and we will continue to follow the UDS. CSW informed MOB that a CPS report will be made if the UDS results positive. MOB expressed understanding and stated she has no additional questions.  CSW asked about any mental health history, MOB stated she has no mental health history. CSW asked MOB if she has experienced any SI, HI, or DV, MOB stated no. MOB identified her spouse/FOB as a support, in addition to her extended family.   CSW provided education  regarding the baby blues period vs. perinatal mood disorders, discussed treatment and gave resources for mental health follow up if concerns arise.  CSW recommends self-evaluation during the postpartum time period using the New Mom Checklist from Postpartum Progress and encouraged MOB to contact a medical professional if symptoms are noted at any time.    CSW provided review of Sudden Infant Death Syndrome (SIDS) precautions.  CSW explained to MOB that baby is not to co-sleep with anyone. MOB expressed understanding and stated baby will sleep in a crib once discharged home.  MOB was unable to provide what pediatrician baby will be going to, stating it is the same one as her current son. MOB expressed she has all of the essential items for baby to discharge home, including a carseat. MOB disclosed the carseat is used. CSW  explained to MOB that careseat expire and she will want to ensure the carseat is not expired before discharging home. CSW asked MOB to let her medical team know if she needs assistance with a carseat prior to discharging. MOB expressed understanding. MOB denied having any transportation issues. CSW asked MOB how she has been feeling and she expressed that she has been feeling good. CSW asked MOB if she would like any resources for food stamps, [facility] or anything else prior to discharging, MOB stated no.  CSW will continue to follow CDS and make a CPS report to [place] as needed.  CSW identifies no further need for intervention and no barriers to discharge at this time.   CSW Plan/Description:  No Further Intervention Required/No Barriers to Discharge, [place], Perinatal Mood and Anxiety Disorder (PMADs) Education, CSW Will Continue to Monitor Umbilical Cord Tissue Drug Screen Results and Make Report if Warranted, Sudden Infant Death Syndrome (SIDS) Education    Waylan Boga, [place] 07/26/2020, 9:58 AM

## 2020-07-27 MED ORDER — ACETAMINOPHEN 325 MG PO TABS: 650.0000 mg | ORAL_TABLET | Freq: Four times a day (QID) | ORAL | Status: DC

## 2020-07-27 MED ORDER — IBUPROFEN 600 MG PO TABS: 600.0000 mg | ORAL_TABLET | Freq: Three times a day (TID) | ORAL | 0 refills | Status: DC | PRN

## 2020-07-27 MED ORDER — COCONUT OIL OIL: 1.0000 "application " | TOPICAL_OIL | 0 refills | Status: DC | PRN

## 2020-08-24 ENCOUNTER — Ambulatory Visit: Payer: 59 | Admitting: Medical

## 2020-08-24 ENCOUNTER — Encounter: Payer: Self-pay | Admitting: Medical

## 2021-09-30 IMAGING — US US MFM OB DETAIL+14 WK
1 series · 13 of 28 positions shown · non-contrast
Comparison: none

[Series 1: us mfm ob detail+14 wk · 97 acquisitions, 13 frames shown]
[im 4/97]
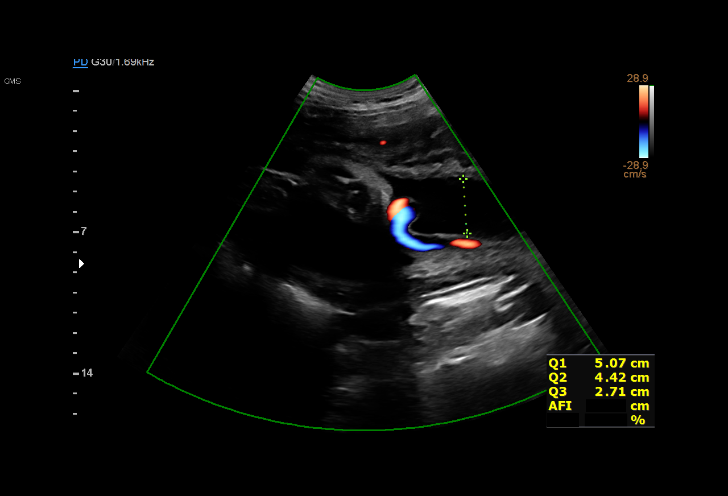
[im 11/97]
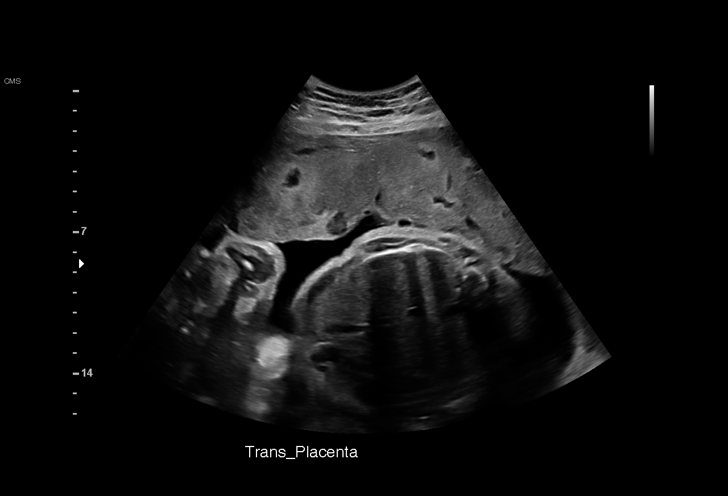
[im 18/97]
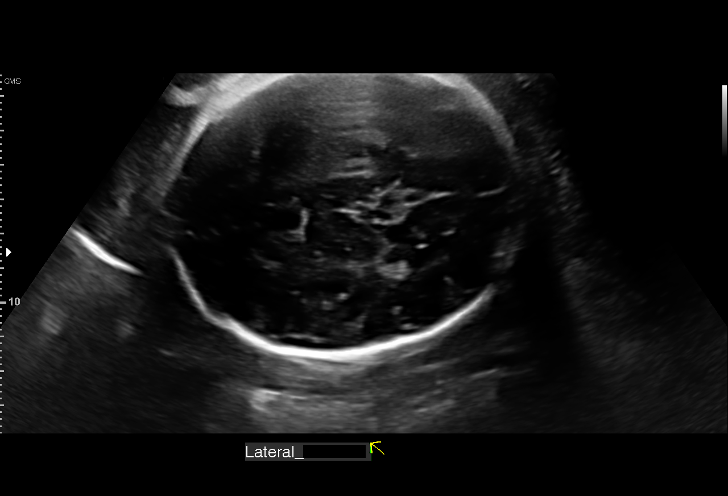
[im 25/97]
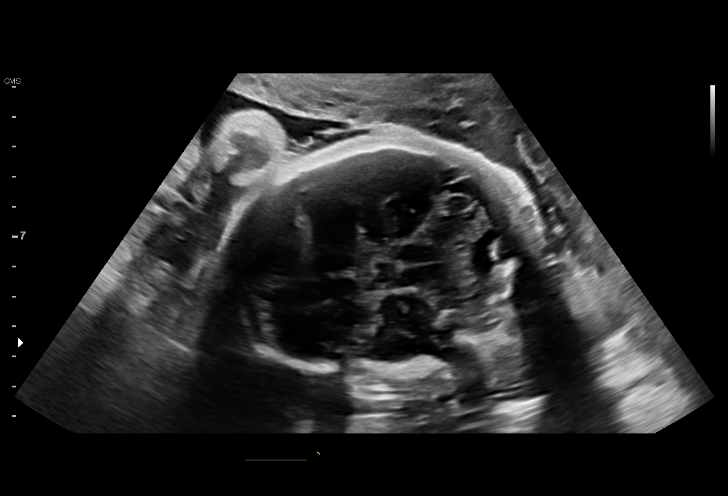
[im 33/97]
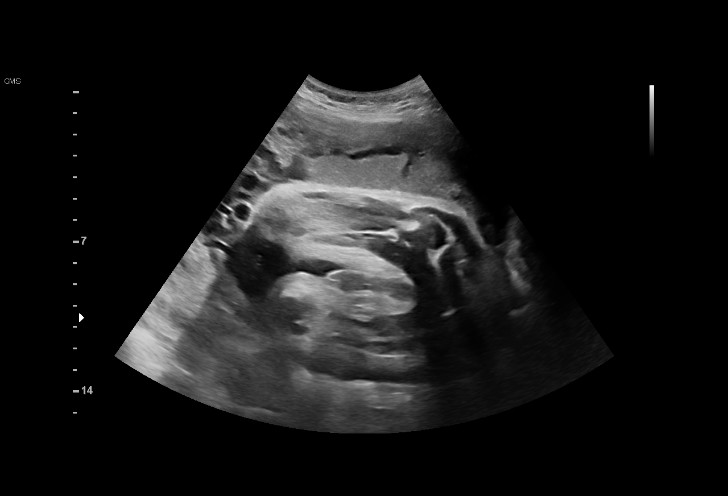
[im 40/97]
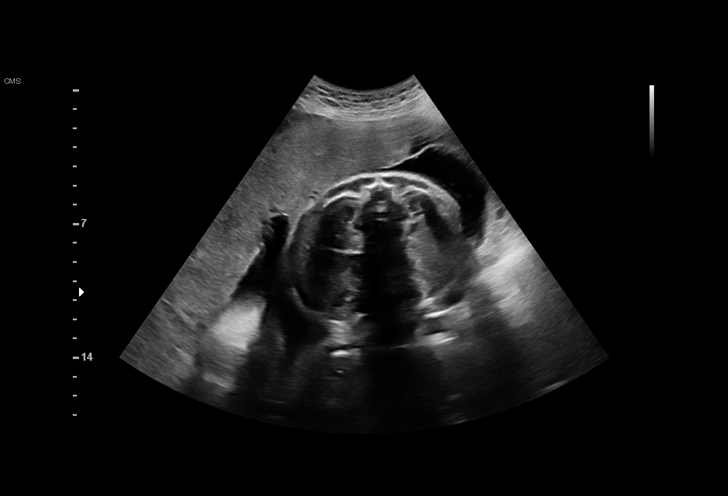
[im 50/97]
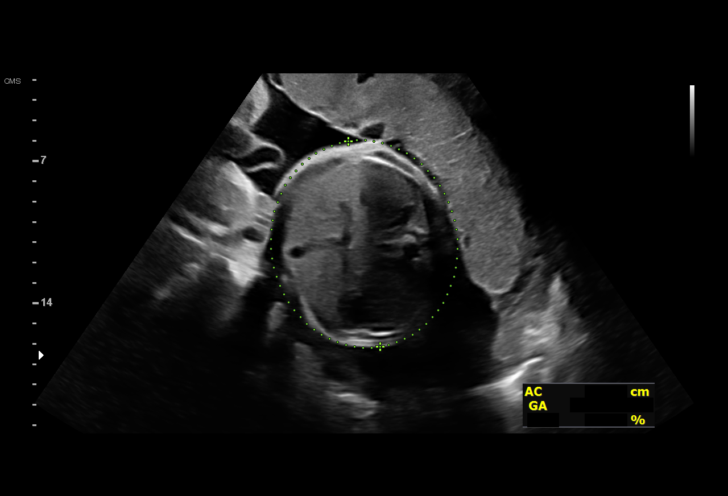
[im 57/97]
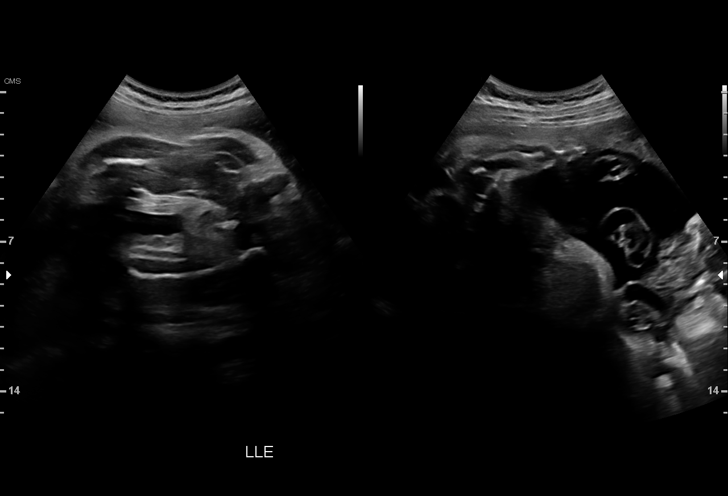
[im 65/97]
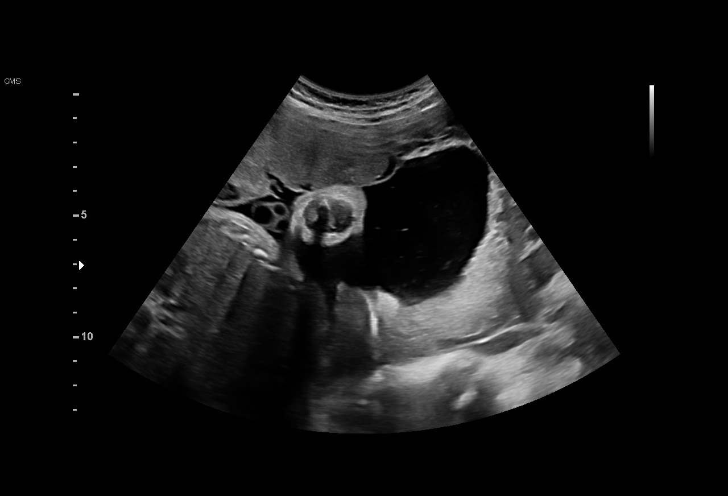
[im 72/97]
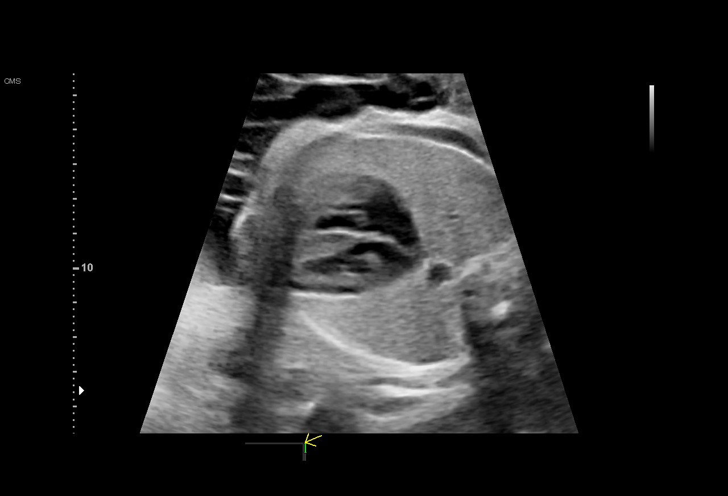
[im 79/97]
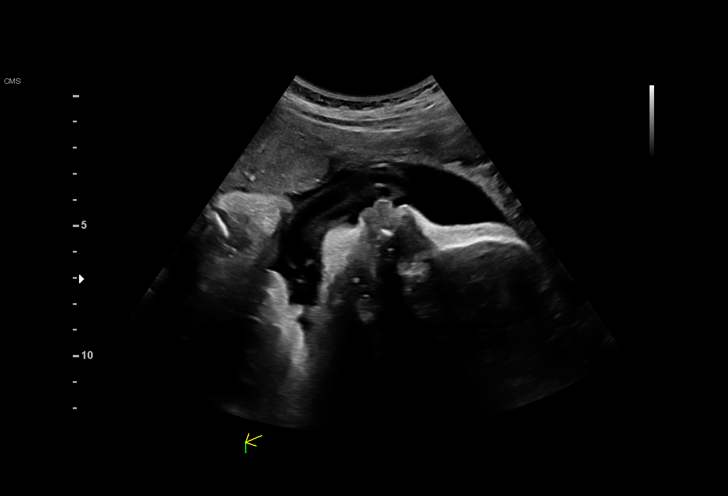
[im 86/97]
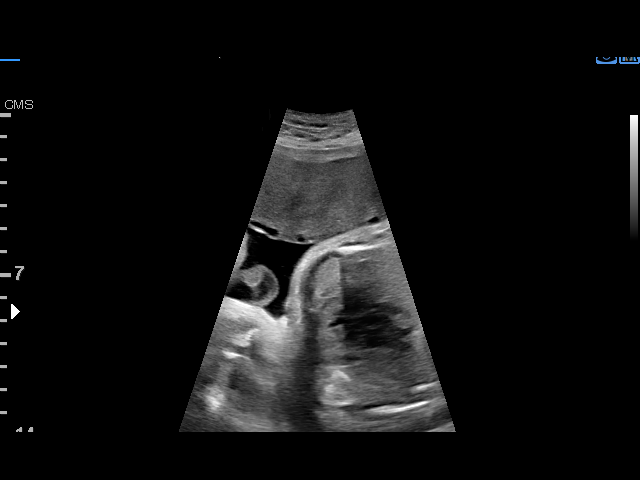
[im 93/97]
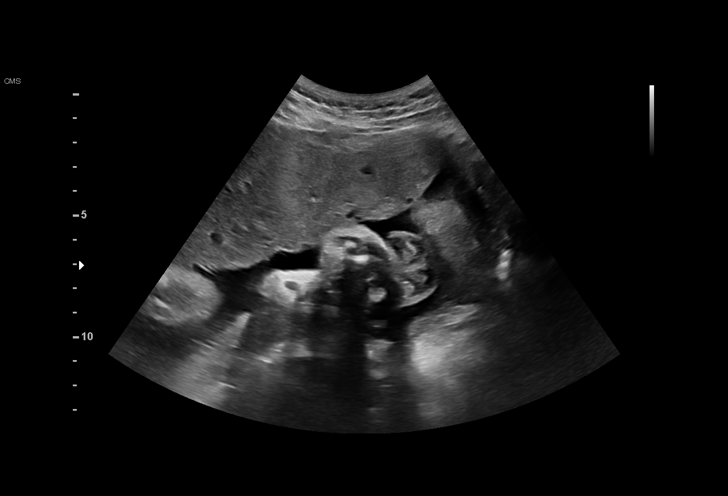

[13 of 28 positions shown; findings below may reference images not displayed]

Indications

 Late to prenatal care, third trimester
 (initiated at 33+5 weeks)
 34 weeks gestation of pregnancy
 Antenatal screening for malformations
Fetal Evaluation

 Num Of Fetuses:         1
 Fetal Heart Rate(bpm):  145
 Cardiac Activity:       Observed
 Presentation:           Cephalic
 Placenta:               Anterior
 P. Cord Insertion:      Visualized

 Amniotic Fluid
 AFI FV:      Within normal limits

 AFI Sum(cm)     %Tile       Largest Pocket(cm)
 17.7            65

 RUQ(cm)       RLQ(cm)       LUQ(cm)        LLQ(cm)

Biometry

 BPD:      82.8  mm     G. Age:  33w 2d         21  %    CI:        72.83   %    70 - 86
                                                         FL/HC:      20.1   %    19.4 -
 HC:      308.5  mm     G. Age:  34w 3d         19  %    HC/AC:      1.02        0.96 -
 AC:      303.6  mm     G. Age:  34w 2d         56  %    FL/BPD:     74.9   %    71 - 87
 FL:         62  mm     G. Age:  32w 1d        3.8  %    FL/AC:      20.4   %    20 - 24
 HUM:      55.5  mm     G. Age:  32w 2d         19  %
 CER:        41  mm     G. Age:  32w 6d         12  %

 Est. FW:    6689  gm    4 lb 15 oz      26  %
OB History

 Gravidity:    2         Term:   1        Prem:   0        SAB:   0
 TOP:          0       Ectopic:  0        Living: 1
Gestational Age

 LMP:           34w 2d        Date:  10/19/19                 EDD:   07/25/20
 U/S Today:     33w 4d                                        EDD:   07/30/20
 Best:          34w 2d     Det. By:  LMP  (10/19/19)          EDD:   07/25/20
Anatomy

 Cranium:               Appears normal         Aortic Arch:            Appears normal
 Cavum:                 Appears normal         Ductal Arch:            Not well visualized
 Ventricles:            Appears normal         Diaphragm:              Appears normal
 Choroid Plexus:        Appears normal         Stomach:                Appears normal, left
                                                                       sided
 Cerebellum:            Appears normal         Abdomen:                Appears normal
 Posterior Fossa:       Appears normal         Abdominal Wall:         Not well visualized
 Nuchal Fold:           Not applicable (>20    Cord Vessels:           Appears normal (3
                        wks GA)                                        vessel cord)
 Face:                  Appears normal         Kidneys:                Appear normal
                        (orbits and profile)
 Lips:                  Appears normal         Bladder:                Appears normal
 Thoracic:              Appears normal         Spine:                  Appears normal
 Heart:                 Appears normal         Upper Extremities:      Visualized
                        (4CH, axis, and
                        situs)
 RVOT:                  Appears normal         Lower Extremities:      Visualized
 LVOT:                  Appears normal

 Other:  Fetus appears to be a male. Technically difficult due to advanced
         gestational age.
Cervix Uterus Adnexa

 Cervix
 Not visualized (advanced GA >62wks)

 Uterus
 No abnormality visualized.

 Right Ovary
 Not visualized.

 Left Ovary
 Not visualized.

 Cul De Sac
 No free fluid seen.
 Adnexa
 No abnormality visualized.
Comments

 This patient was seen for a detailed fetal anatomy scan as
 she presented late for prenatal care at around 33 weeks.
 She denies any significant past medical history and denies
 any problems in her current pregnancy.
 She had a cell free DNA test earlier in her pregnancy which
 indicated a low risk for trisomy 21, 18, and 13. A male fetus is
 predicted.
 She was informed that the fetal growth and amniotic fluid
 level were appropriate for her gestational age.
 The views of the fetal anatomy were limited today due to her
 advanced gestational age. However, what was visualized
 today appeared within normal limits.
 The patient was informed that anomalies may be missed due
 to technical limitations. If the fetus is in a suboptimal position
 or maternal habitus is increased, visualization of the fetus in
 the maternal uterus may be impaired.
 Follow up as indicated.

## 2023-05-19 ENCOUNTER — Ambulatory Visit: Payer: Medicaid Other | Attending: Nurse Practitioner | Admitting: Nurse Practitioner

## 2023-05-19 ENCOUNTER — Encounter: Payer: Self-pay | Admitting: Nurse Practitioner

## 2023-05-19 VITALS — BP 94/63 | HR 62 | Ht 67.0 in | Wt 143.6 lb

## 2023-05-19 DIAGNOSIS — D649 Anemia, unspecified: Secondary | ICD-10-CM

## 2023-05-19 DIAGNOSIS — Z0000 Encounter for general adult medical examination without abnormal findings: Secondary | ICD-10-CM | POA: Diagnosis not present

## 2023-05-19 DIAGNOSIS — E559 Vitamin D deficiency, unspecified: Secondary | ICD-10-CM | POA: Diagnosis not present

## 2023-05-19 DIAGNOSIS — R5382 Chronic fatigue, unspecified: Secondary | ICD-10-CM

## 2023-05-19 DIAGNOSIS — R7309 Other abnormal glucose: Secondary | ICD-10-CM

## 2023-05-19 NOTE — Patient Instructions (Addendum)
BIOTIN 10,000-20,000 mcg daily  B complex for energy

## 2023-05-19 NOTE — Progress Notes (Signed)
Assessment & Plan:  Colleen Sampson was seen today for establish care.  Diagnoses and all orders for this visit:  Encounter for annual physical exam -     Thyroid Panel With TSH -     CBC with Differential -     CMP14+EGFR -     Hemoglobin A1c -     VITAMIN D 25 Hydroxy (Vit-D Deficiency, Fractures)  Anemia, unspecified type -     Thyroid Panel With TSH -     CBC with Differential  Chronic fatigue -     Thyroid Panel With TSH -     CBC with Differential -     CMP14+EGFR  Vitamin D deficiency disease -     VITAMIN D 25 Hydroxy (Vit-D Deficiency, Fractures)  Elevated glucose -     Hemoglobin A1c    Patient has been counseled on age-appropriate routine health concerns for screening and prevention. These are reviewed and up-to-date. Referrals have been placed accordingly. Immunizations are up-to-date or declined.    Subjective:   Chief Complaint  Patient presents with   Establish Care   HPI Colleen Sampson 27 y.o. female presents to office today to re establish care and for annual physical exam.   She is accompanied by her spouse today who is translating for her. Her 2 young sons are also present.   Fatigue: Patient complains of fatigue. Symptoms began several months ago. Sentinal symptom the patient feels fatigue began with: none. Symptoms of her fatigue have been general malaise. Patient describes the following psychologic symptoms: none.  Patient denies fever, significant change in weight, symptoms of arthritis, exercise intolerance, unusual rashes, cold intolerance, constipation and change in hair texture., GI blood loss, excessive menstrual bleeding, and witnessed or suspected sleep apnea. Symptoms have progressed to a point and plateaued. Severity has been symptoms bothersome, but easily able to carry out all usual work/school/family activities. Previous visits for this problem: none.    Review of Systems  Constitutional:  Positive for malaise/fatigue. Negative for fever and  weight loss.  HENT: Negative.  Negative for nosebleeds.   Eyes: Negative.  Negative for blurred vision, double vision and photophobia.  Respiratory: Negative.  Negative for cough and shortness of breath.   Cardiovascular: Negative.  Negative for chest pain, palpitations and leg swelling.  Gastrointestinal: Negative.  Negative for heartburn, nausea and vomiting.  Genitourinary: Negative.   Musculoskeletal: Negative.  Negative for myalgias.  Skin: Negative.   Neurological: Negative.  Negative for dizziness, focal weakness, seizures and headaches.  Endo/Heme/Allergies: Negative.   Psychiatric/Behavioral: Negative.  Negative for suicidal ideas.     Past Medical History:  Diagnosis Date   Medical history non-contributory     Past Surgical History:  Procedure Laterality Date   NO PAST SURGERIES      Family History  Problem Relation Age of Onset   Hypertension Mother    Hypertension Father     Social History Reviewed with no changes to be made today.   Outpatient Medications Prior to Visit  Medication Sig Dispense Refill   acetaminophen (TYLENOL) 325 MG tablet Take 2 tablets (650 mg total) by mouth every 6 (six) hours. (Patient not taking: Reported on 05/19/2023)     coconut oil OIL Apply 1 application topically as needed (nipple pain). (Patient not taking: Reported on 05/19/2023)  0   ibuprofen (ADVIL) 600 MG tablet Take 1 tablet (600 mg total) by mouth every 8 (eight) hours as needed for moderate pain or cramping. (Patient not taking: Reported  on 05/19/2023) 30 tablet 0   Prenatal Vit-Fe Fumarate-FA (PRENATAL VITAMIN) 27-0.8 MG TABS Take 1 tablet by mouth daily. (Patient not taking: Reported on 05/19/2023) 30 tablet 1   No facility-administered medications prior to visit.    No Known Allergies     Objective:    BP 94/63 (BP Location: Left Arm, Patient Position: Sitting, Cuff Size: Normal)   Pulse 62   Ht 5\' 7"  (1.702 m)   Wt 143 lb 9.6 oz (65.1 kg)   LMP 04/27/2023 (Exact Date)    SpO2 100%   BMI 22.49 kg/m  Wt Readings from Last 3 Encounters:  05/19/23 143 lb 9.6 oz (65.1 kg)  07/25/20 158 lb (71.7 kg)  07/12/20 159 lb (72.1 kg)    Physical Exam Constitutional:      Appearance: She is well-developed.  HENT:     Head: Normocephalic and atraumatic.     Right Ear: Hearing, tympanic membrane, ear canal and external ear normal.     Left Ear: Hearing, tympanic membrane, ear canal and external ear normal.     Nose: Nose normal.     Right Turbinates: Not enlarged.     Left Turbinates: Not enlarged.     Mouth/Throat:     Lips: Pink.     Mouth: Mucous membranes are moist.     Dentition: No dental tenderness, gingival swelling, dental abscesses or gum lesions.     Pharynx: No oropharyngeal exudate.  Eyes:     General: No scleral icterus.       Right eye: No discharge.     Extraocular Movements: Extraocular movements intact.     Conjunctiva/sclera: Conjunctivae normal.     Pupils: Pupils are equal, round, and reactive to light.  Neck:     Thyroid: No thyromegaly.     Trachea: No tracheal deviation.  Cardiovascular:     Rate and Rhythm: Normal rate and regular rhythm.     Heart sounds: Normal heart sounds. No murmur heard.    No friction rub.  Pulmonary:     Effort: Pulmonary effort is normal. No accessory muscle usage or respiratory distress.     Breath sounds: Normal breath sounds. No decreased breath sounds, wheezing, rhonchi or rales.  Abdominal:     General: Bowel sounds are normal. There is no distension.     Palpations: Abdomen is soft. There is no mass.     Tenderness: There is no abdominal tenderness. There is no right CVA tenderness, left CVA tenderness, guarding or rebound.     Hernia: No hernia is present.  Musculoskeletal:        General: No tenderness or deformity. Normal range of motion.     Cervical back: Normal range of motion and neck supple.  Lymphadenopathy:     Cervical: No cervical adenopathy.  Skin:    General: Skin is warm and  dry.     Findings: No erythema.  Neurological:     Mental Status: She is alert and oriented to person, place, and time.     Cranial Nerves: No cranial nerve deficit.     Motor: Motor function is intact.     Coordination: Coordination is intact. Coordination normal.     Gait: Gait is intact.     Deep Tendon Reflexes:     Reflex Scores:      Patellar reflexes are 1+ on the right side and 1+ on the left side. Psychiatric:        Attention and Perception: Attention normal.  Mood and Affect: Mood normal.        Speech: Speech normal.        Behavior: Behavior normal.        Thought Content: Thought content normal.        Judgment: Judgment normal.         Patient has been counseled extensively about nutrition and exercise as well as the importance of adherence with medications and regular follow-up. The patient was given clear instructions to go to ER or return to medical center if symptoms don't improve, worsen or new problems develop. The patient verbalized understanding.   Follow-up: Return if symptoms worsen or fail to improve.   Claiborne Rigg, FNP-BC [facility] [place], [place] [phone]   05/19/2023, 3:54 PM

## 2023-05-20 ENCOUNTER — Other Ambulatory Visit: Payer: Self-pay | Admitting: Nurse Practitioner

## 2023-05-20 DIAGNOSIS — E559 Vitamin D deficiency, unspecified: Secondary | ICD-10-CM

## 2023-05-20 DIAGNOSIS — R7989 Other specified abnormal findings of blood chemistry: Secondary | ICD-10-CM

## 2023-05-20 LAB — CBC WITH DIFFERENTIAL/PLATELET
Basophils Absolute: 0 10*3/uL (ref 0.0–0.2)
Basos: 1 %
EOS (ABSOLUTE): 0.3 10*3/uL (ref 0.0–0.4)
Eos: 5 %
Hematocrit: 38.3 % (ref 34.0–46.6)
Hemoglobin: 12.6 g/dL (ref 11.1–15.9)
Immature Grans (Abs): 0 10*3/uL (ref 0.0–0.1)
Immature Granulocytes: 0 %
Lymphocytes Absolute: 2 10*3/uL (ref 0.7–3.1)
Lymphs: 34 %
MCH: 27.7 pg (ref 26.6–33.0)
MCHC: 32.9 g/dL (ref 31.5–35.7)
MCV: 84 fL (ref 79–97)
Monocytes Absolute: 0.6 10*3/uL (ref 0.1–0.9)
Monocytes: 10 %
Neutrophils Absolute: 2.9 10*3/uL (ref 1.4–7.0)
Neutrophils: 50 %
Platelets: 365 10*3/uL (ref 150–450)
RBC: 4.55 x10E6/uL (ref 3.77–5.28)
RDW: 12.4 % (ref 11.7–15.4)
WBC: 5.8 10*3/uL (ref 3.4–10.8)

## 2023-05-20 LAB — CMP14+EGFR
ALT: 33 IU/L — ABNORMAL HIGH (ref 0–32)
AST: 22 IU/L (ref 0–40)
Albumin: 4.3 g/dL (ref 4.0–5.0)
Alkaline Phosphatase: 60 IU/L (ref 44–121)
BUN/Creatinine Ratio: 15 (ref 9–23)
BUN: 9 mg/dL (ref 6–20)
Bilirubin Total: <0.2 mg/dL (ref 0.0–1.2)
CO2: 23 mmol/L (ref 20–29)
Calcium: 9.4 mg/dL (ref 8.7–10.2)
Chloride: 101 mmol/L (ref 96–106)
Creatinine, Ser: 0.6 mg/dL (ref 0.57–1.00)
Globulin, Total: 3.2 g/dL (ref 1.5–4.5)
Glucose: 72 mg/dL (ref 70–99)
Potassium: 4.1 mmol/L (ref 3.5–5.2)
Sodium: 137 mmol/L (ref 134–144)
Total Protein: 7.5 g/dL (ref 6.0–8.5)
eGFR: 125 mL/min/{1.73_m2} (ref >59)

## 2023-05-20 LAB — HEMOGLOBIN A1C
Est. average glucose Bld gHb Est-mCnc: 100 mg/dL
Hgb A1c MFr Bld: 5.1 % (ref 4.8–5.6)

## 2023-05-20 LAB — THYROID PANEL WITH TSH
Free Thyroxine Index: 1.8 (ref 1.2–4.9)
T3 Uptake Ratio: 22 % — ABNORMAL LOW (ref 24–39)
T4, Total: 8.4 ug/dL (ref 4.5–12.0)
TSH: 0.239 u[IU]/mL — ABNORMAL LOW (ref 0.450–4.500)

## 2023-05-20 LAB — VITAMIN D 25 HYDROXY (VIT D DEFICIENCY, FRACTURES): Vit D, 25-Hydroxy: 11.6 ng/mL — ABNORMAL LOW (ref 30.0–100.0)

## 2023-05-20 MED ORDER — VITAMIN D (ERGOCALCIFEROL) 1.25 MG (50000 UNIT) PO CAPS: 50000.0000 [IU] | ORAL_CAPSULE | ORAL | 0 refills | Status: AC

## 2023-05-21 ENCOUNTER — Encounter: Payer: Self-pay | Admitting: Nurse Practitioner

## 2023-06-18 ENCOUNTER — Ambulatory Visit: Payer: Medicaid Other | Attending: Nurse Practitioner

## 2023-06-18 DIAGNOSIS — R7989 Other specified abnormal findings of blood chemistry: Secondary | ICD-10-CM

## 2023-09-21 ENCOUNTER — Other Ambulatory Visit: Payer: Medicaid Other
